# Patient Record
Sex: Male | Born: 1935 | Hispanic: No | Marital: Single | Smoking: Never smoker
Health system: Southern US, Community
[De-identification: ages and names within clinical notes are randomized; demographics above are authoritative.]

## PROBLEM LIST (undated history)

## (undated) DIAGNOSIS — N185 Chronic kidney disease, stage 5: Secondary | ICD-10-CM

## (undated) DIAGNOSIS — E7800 Pure hypercholesterolemia, unspecified: Secondary | ICD-10-CM

## (undated) DIAGNOSIS — I10 Essential (primary) hypertension: Secondary | ICD-10-CM

---

## 2010-02-27 ENCOUNTER — Ambulatory Visit: Admit: 2010-02-27 | Payer: Self-pay | Admitting: Vascular Surgery

## 2010-02-27 ENCOUNTER — Ambulatory Visit
Admission: RE | Admit: 2010-02-27 | Discharge: 2010-02-27 | Payer: Self-pay | Source: Home / Self Care | Attending: Vascular Surgery | Admitting: Vascular Surgery

## 2010-04-02 ENCOUNTER — Ambulatory Visit (HOSPITAL_COMMUNITY)
Admission: RE | Admit: 2010-04-02 | Discharge: 2010-04-02 | Disposition: A | Discharge status: Home / Self Care | Payer: Medicare Other | Source: Ambulatory Visit | Attending: Vascular Surgery | Admitting: Vascular Surgery

## 2010-04-02 ENCOUNTER — Other Ambulatory Visit: Payer: Self-pay | Admitting: Vascular Surgery

## 2010-04-02 ENCOUNTER — Encounter (HOSPITAL_COMMUNITY)
Admission: RE | Admit: 2010-04-02 | Discharge: 2010-04-02 | Disposition: A | Discharge status: Home / Self Care | Payer: Medicare Other | Source: Ambulatory Visit | Attending: Vascular Surgery | Admitting: Vascular Surgery

## 2010-04-02 DIAGNOSIS — Z01818 Encounter for other preprocedural examination: Secondary | ICD-10-CM | POA: Insufficient documentation

## 2010-04-02 DIAGNOSIS — I359 Nonrheumatic aortic valve disorder, unspecified: Secondary | ICD-10-CM | POA: Insufficient documentation

## 2010-04-02 DIAGNOSIS — N186 End stage renal disease: Secondary | ICD-10-CM

## 2010-04-02 DIAGNOSIS — I120 Hypertensive chronic kidney disease with stage 5 chronic kidney disease or end stage renal disease: Secondary | ICD-10-CM | POA: Insufficient documentation

## 2010-04-02 LAB — SURGICAL PCR SCREEN
MRSA, PCR: NEGATIVE
Staphylococcus aureus: NEGATIVE

## 2010-04-02 LAB — CBC
MCH: 32.5 pg (ref 26.0–34.0)
MCHC: 33.4 g/dL (ref 30.0–36.0)
Platelets: 199 10*3/uL (ref 150–400)
RBC: 4.19 MIL/uL — ABNORMAL LOW (ref 4.22–5.81)
RDW: 12.8 % (ref 11.5–15.5)

## 2010-04-02 LAB — BASIC METABOLIC PANEL
Calcium: 9.9 mg/dL (ref 8.4–10.5)
Chloride: 106 mEq/L (ref 96–112)
Creatinine, Ser: 4.64 mg/dL — ABNORMAL HIGH (ref 0.4–1.5)
GFR calc Af Amer: 15 mL/min — ABNORMAL LOW (ref >60)
GFR calc non Af Amer: 12 mL/min — ABNORMAL LOW (ref >60)

## 2010-04-04 ENCOUNTER — Ambulatory Visit (HOSPITAL_COMMUNITY)
Admission: RE | Admit: 2010-04-04 | Discharge: 2010-04-04 | Disposition: A | Discharge status: Home / Self Care | Payer: Medicare Other | Source: Ambulatory Visit | Attending: Vascular Surgery | Admitting: Vascular Surgery

## 2010-04-04 DIAGNOSIS — I120 Hypertensive chronic kidney disease with stage 5 chronic kidney disease or end stage renal disease: Secondary | ICD-10-CM

## 2010-04-04 DIAGNOSIS — N186 End stage renal disease: Secondary | ICD-10-CM | POA: Insufficient documentation

## 2010-04-08 NOTE — Op Note (Signed)
  Robert Weber, Robert Weber                   ACCOUNT NO.:  [id number]  MEDICAL RECORD NO.:  [id number]           PATIENT TYPE:  O  LOCATION:  SDSC                         FACILITY:  [facility]  PHYSICIAN:  Di Kindle. Edilia Bo, M.D.DATE OF BIRTH:  Jun 30, 1931  DATE OF PROCEDURE:  04/04/2010 DATE OF DISCHARGE:  04/04/2010                              OPERATIVE REPORT   PREOPERATIVE DIAGNOSIS:  Chronic kidney disease.  POSTOPERATIVE DIAGNOSIS:  Chronic kidney disease.  PROCEDURE:  Placement of left radiocephalic arteriovenous fistula.  SURGEON:  Di Kindle. Edilia Bo, MD  ASSISTANT:  Nurse.  ANESTHESIA:  General.  TECHNIQUE:  The patient was taken to the operating room, sedated by Anesthesia, and then received a LMA.  The left upper extremity was prepped and draped in usual sterile fashion.  The cephalic vein was fairly far lateral on the wrist, and therefore, I elected to make two separate incisions, one over the vein and one over the radial artery. Longitudinal incision was made over the radial artery.  The radial artery was good size and soft with no significant plaque.  A longitudinal incision was made over the cephalic vein which was dissected free and ligated distally, irrigated nicely with heparinized saline, it was about a 4.5-mm vein.  Several branches were divided between ties.  The vein was then mobilized over for anastomosis to the radial artery.  The patient was heparinized.  The radial artery was clamped proximally and distally, and longitudinal arteriotomy was made. The vein was spatulated and sewn end-to-side to the artery using continuous 6-0 Prolene suture.  At the completion, there was an excellent thrill in the fistula.  Hemostasis was obtained in the wounds, the wounds were closed with deep layer of 3-0 Vicryl.  The skin closed with 4-0 Vicryl.  Sterile dressing was applied.  The patient tolerated the procedure well and was transferred to recovery room in  stable condition.  All needle and sponge counts were correct.     Di Kindle. Edilia Bo, M.D.     CSD/MEDQ  D:  04/04/2010  T:  04/04/2010  Job:  [id number]  Electronically Signed by Waverly Ferrari M.D. on 04/08/2010 07:54:33 AM

## 2010-05-15 ENCOUNTER — Ambulatory Visit (INDEPENDENT_AMBULATORY_CARE_PROVIDER_SITE_OTHER): Payer: Medicare Other | Admitting: Vascular Surgery

## 2010-05-15 DIAGNOSIS — N186 End stage renal disease: Secondary | ICD-10-CM

## 2010-05-16 NOTE — Assessment & Plan Note (Signed)
OFFICE VISIT  Robert Weber, Robert Weber DOB:  1931/09/15                                       05/15/2010 CHART#:[id number]  I saw the patient in the office today for followup after placement of his left radiocephalic fistula on April 04, 2010.  He is not yet on dialysis.  He comes in for a routine check of his fistula.  On examination, blood pressure is 142/70, heart rate is 94, temperature 97.4.  His incision in the left wrist has healed nicely.  The fistula appears to be enlarging nicely and has an excellent thrill.  I think the fistula should provide adequate access if it is needed. However, hopefully, he will not require dialysis.  I will see him back p.r.n.    Di Kindle. Edilia Bo, M.D. Electronically Signed  CSD/MEDQ  D:  05/15/2010  T:  05/16/2010  Job:  [id number]  cc:   Cecille Aver, M.D.

## 2010-07-02 NOTE — Consult Note (Signed)
NEW PATIENT CONSULTATION   Robert Weber, Robert Weber  DOB:  Jul 07, 1931                                       02/27/2010  CHART#:[id number]   I saw the patient in the office today in consultation for hemodialysis  access.  He was referred by Dr. Kathrene Bongo.  This is a pleasant 77-  year-old right-handed gentleman with a history of chronic kidney  disease.  The etiology of this is unclear.  Dr. Kathrene Bongo feels that  he may have some form of a chronic glomerulonephritis.  It is felt that  he will ultimately require dialysis and we were asked to place access.  Of note, he had no recent uremic symptoms.  Specifically, he denies  nausea, vomiting, fatigue, anorexia, palpitations, or shortness of  breath.   PAST MEDICAL HISTORY:  1. Hypertension.  2. Hyperlipidemia.  3. Hypothyroidism.  4. Stage 4 chronic kidney disease.  5. He denies any history of diabetes, history of previous myocardial      infarction, history of congestive heart failure, or history of      COPD.   SOCIAL HISTORY:  He is single.  He does not smoke cigarettes.   FAMILY HISTORY:  There is no history of premature cardiovascular  disease.   REVIEW OF SYSTEMS:  GENERAL:  He has had no weight loss, weight gain, or  problems with his appetite.   CARDIOVASCULAR:  He has had no chest pain, chest pressure, palpitations,  or arrhythmias.  He has had no claudication, rest pain, or nonhealing  ulcers.  He had no history of DVT or phlebitis.   MUSCULOSKELETAL:  He does have arthritis in his hands.   GU:  He had no dysuria or frequency.   HEMATOLOGIC:  He had no bleeding problems or clotting disorders.   GI, NEUROLOGIC, PULMONARY, ENT, PSYCHIATRIC review of systems are  unremarkable.   SKIN:  He has had a previous skin cancer removed from his right forearm.   PHYSICAL EXAMINATION:  This is a pleasant 77 year old gentleman who  appears his stated age.   Blood pressure is 144/73, heart rate is 78,  saturation 99%.   HEENT:  Unremarkable.   Lungs are clear bilaterally to auscultation without rales, rhonchi, or  wheezing.   Cardiovascular exam:  I do not detect any carotid bruits.  He has a  regular rate and rhythm.  He has palpable femoral pulses and warm, well-  perfused feet without ischemic ulcers.  He has no significant lower  extremity swelling.  He has palpable radial pulses bilaterally.   Abdomen:  Soft and nontender with normal pitched bowel sounds.   Musculoskeletal exam:  No major deformities or cyanosis.   Neurologic exam:  No focal weakness or paresthesias.   Skin:  There are no ulcers or rashes.   Extremity exam:  It appears that he has a reasonable forearm cephalic  vein in the left arm.   I have independently interpreted his vein mapping.  It appears that he  has a reasonable basilic vein in both upper arms.  In the left forearm,  the vein is noted to be fairly small and in the upper arm it cannot be  visualized.  I suspect the cephalic vein in the left forearm empties  into the deep system or the basilic vein.  On the right side, the  forearm vein is  also marginal in size as is the upper arm cephalic vein.   I have also reviewed his records from Dr. Jon Gills office.  He did  have labs done on January 17, 2010, which at that time showed a  potassium of 4.3 and his creatinine was 3.43 which remained relatively  stable.   Based on his exam, I think he does have a chance of having a  radiocephalic fistula on the left.  If this vein is not found be  adequate, then he could potentially have a basilic vein transposition on  the left.  If neither are adequate, he can have an AV graft placed.  We  have this indications for surgery and the potential complications  including but not limited to failure of the fistula to mature, graft  thrombosis, graft infection, steal syndrome, bleeding, arm swelling, or  other unpredictable medical problems.  All of his  questions were  answered.  Currently, he does not want to schedule surgery until he has  had a chance to see Dr. Kathrene Bongo one more time.  Once he is  comfortable with scheduling surgery, we will be happy to arrange for  placement of access in his left arm.     Di Kindle. Edilia Bo, M.D.  Electronically Signed   CSD/MEDQ  D:  02/27/2010  T:  02/28/2010  Job:  [id number]   cc:   Cecille Aver, M.D.

## 2010-07-02 NOTE — Procedures (Signed)
CEPHALIC VEIN MAPPING   INDICATION:  Preoperative AVF placement.   HISTORY:  Chronic kidney disease, stage 4, hypertension, hyperlipidemia.   EXAM:  The right cephalic vein is compressible.   Diameter measurements range from 0.19 to 0.27 cm.   The right basilic vein is compressible.   Diameter measurements range from 0.19 to 0.43 cm.   The left cephalic vein is compressible.   Diameter measurements range from 0.09 to 0.20 cm.   The left  basilic vein is compressible.   Diameter measurements range from 0.14 to 0.43.   See attached worksheet for all measurements.    IMPRESSION:  Patent right and left cephalic and basilic veins with  diameter measurements as described above.          ___________________________________________  Di Kindle. Edilia Bo, M.D.   EM/MEDQ  D:  02/27/2010  T:  02/27/2010  Job:  [id number]

## 2013-05-18 ENCOUNTER — Encounter (HOSPITAL_COMMUNITY): Payer: Self-pay | Admitting: Internal Medicine

## 2013-05-18 ENCOUNTER — Inpatient Hospital Stay (HOSPITAL_COMMUNITY)
Admission: AD | Admit: 2013-05-18 | Discharge: 2013-05-26 | DRG: 469 | Disposition: A | Discharge status: Skilled Nursing Facility | Payer: Medicare Other | Source: Other Acute Inpatient Hospital | Attending: Internal Medicine | Admitting: Internal Medicine

## 2013-05-18 DIAGNOSIS — W19XXXA Unspecified fall, initial encounter: Secondary | ICD-10-CM | POA: Diagnosis present

## 2013-05-18 DIAGNOSIS — D62 Acute posthemorrhagic anemia: Secondary | ICD-10-CM

## 2013-05-18 DIAGNOSIS — S72009A Fracture of unspecified part of neck of unspecified femur, initial encounter for closed fracture: Principal | ICD-10-CM

## 2013-05-18 DIAGNOSIS — IMO0002 Reserved for concepts with insufficient information to code with codable children: Secondary | ICD-10-CM

## 2013-05-18 DIAGNOSIS — Y92009 Unspecified place in unspecified non-institutional (private) residence as the place of occurrence of the external cause: Secondary | ICD-10-CM

## 2013-05-18 DIAGNOSIS — E7800 Pure hypercholesterolemia, unspecified: Secondary | ICD-10-CM | POA: Diagnosis present

## 2013-05-18 DIAGNOSIS — E669 Obesity, unspecified: Secondary | ICD-10-CM | POA: Diagnosis present

## 2013-05-18 DIAGNOSIS — D638 Anemia in other chronic diseases classified elsewhere: Secondary | ICD-10-CM | POA: Diagnosis present

## 2013-05-18 DIAGNOSIS — N186 End stage renal disease: Secondary | ICD-10-CM | POA: Diagnosis present

## 2013-05-18 DIAGNOSIS — N185 Chronic kidney disease, stage 5: Secondary | ICD-10-CM

## 2013-05-18 DIAGNOSIS — E872 Acidosis, unspecified: Secondary | ICD-10-CM | POA: Diagnosis present

## 2013-05-18 DIAGNOSIS — E039 Hypothyroidism, unspecified: Secondary | ICD-10-CM | POA: Diagnosis present

## 2013-05-18 DIAGNOSIS — S72002A Fracture of unspecified part of neck of left femur, initial encounter for closed fracture: Secondary | ICD-10-CM

## 2013-05-18 DIAGNOSIS — I120 Hypertensive chronic kidney disease with stage 5 chronic kidney disease or end stage renal disease: Secondary | ICD-10-CM | POA: Diagnosis present

## 2013-05-18 HISTORY — DX: Pure hypercholesterolemia, unspecified: E78.00

## 2013-05-18 HISTORY — DX: Chronic kidney disease, stage 5: N18.5

## 2013-05-18 HISTORY — DX: Essential (primary) hypertension: I10

## 2013-05-18 MED ORDER — SIMVASTATIN 20 MG PO TABS
20.0000 mg | ORAL_TABLET | Freq: Every day | ORAL | Status: DC
  Administered 2013-05-19 – 2013-05-26 (×7): 20 mg via ORAL
  Filled 2013-05-18 (×8): qty 1

## 2013-05-18 MED ORDER — SODIUM CHLORIDE 0.9 % IV SOLN
INTRAVENOUS | Status: DC
  Administered 2013-05-19: 01:00:00 via INTRAVENOUS

## 2013-05-18 MED ORDER — CALCITRIOL 0.25 MCG PO CAPS
0.2500 ug | ORAL_CAPSULE | Freq: Every day | ORAL | Status: DC
  Administered 2013-05-19 – 2013-05-26 (×7): 0.25 ug via ORAL
  Filled 2013-05-18 (×9): qty 1

## 2013-05-18 MED ORDER — LEVOTHYROXINE SODIUM 100 MCG PO TABS
100.0000 ug | ORAL_TABLET | Freq: Every day | ORAL | Status: DC
  Administered 2013-05-19 – 2013-05-26 (×7): 100 ug via ORAL
  Filled 2013-05-18 (×9): qty 1

## 2013-05-18 MED ORDER — HYDROCODONE-ACETAMINOPHEN 5-325 MG PO TABS
1.0000 | ORAL_TABLET | Freq: Four times a day (QID) | ORAL | Status: DC | PRN
  Administered 2013-05-19 – 2013-05-20 (×3): 2 via ORAL
  Filled 2013-05-18 (×3): qty 2
  Filled 2013-05-18: qty 1
  Filled 2013-05-18 (×2): qty 2

## 2013-05-18 MED ORDER — MORPHINE SULFATE 2 MG/ML IJ SOLN
0.5000 mg | INTRAMUSCULAR | Status: DC | PRN
  Filled 2013-05-18: qty 1

## 2013-05-18 MED ORDER — AMLODIPINE BESYLATE 10 MG PO TABS
10.0000 mg | ORAL_TABLET | Freq: Every day | ORAL | Status: DC
  Administered 2013-05-19 – 2013-05-20 (×2): 10 mg via ORAL
  Filled 2013-05-18 (×4): qty 1

## 2013-05-18 MED ORDER — ASPIRIN EC 325 MG PO TBEC
325.0000 mg | DELAYED_RELEASE_TABLET | Freq: Every day | ORAL | Status: DC
  Administered 2013-05-19 – 2013-05-20 (×2): 325 mg via ORAL
  Filled 2013-05-18 (×3): qty 1

## 2013-05-18 NOTE — Progress Notes (Signed)
PENDING ACCEPTANCE TRANFER NOTE:  [facility]  REASON FOR REQUESTING TRANSFER:    Left hip fracture and renal failure  HPI:   80 year old male with past medical history of CKD stage IV, or it has calluses access in place since 2012. [facility] with [facility] and left hip fracture, also was found to have creatinine of 7.0. Baseline creatinine about 4 to 4.6. Dr. Eulah PontMurphy of orthopedics oxygen the patient, anticipated that he might need dialysis with this high creatinine patient [facility].  Last set of vitals respirations 18, blood pressure 115/76, pulse is 83 and saturation is 96% on room air.   PLAN:  According to telephone report, this [facility]   [facility]  [id number],  I have requested an order be written to call Flow Manager at [phone] upon patient arrival to the floor for final physician assignment who will do the admission and give admitting orders.  SIGNED: Clint LippsELMAHI,Karisa Nesser A, MD [facility]  05/18/2013, 5:44 PM

## 2013-05-18 NOTE — H&P (Signed)
[facility] History and Physical  Traveon Louro RUE:[id number] DOB: 10-20-1931 DOA: 05/18/2013  Referring physician: EDP PCP: No primary provider on file.   Chief Complaint: Fall, hip pain   HPI: Capri Raben is a 80 y.o. male who lost his ballance and fell at home.  He landed on his left hip.  Had severe left hip pain, worse with movement, better at rest.  As a result he presents to the ED at [facility].  In the ED patient found to have a left hip fracture, also has a large hematoma of his left shin.  His creatinine was noted to be 7.0, late last year it was last checked and found to be at his baseline of 4.6.  His baseline has been 4.6 since 2012 when he had a dialysis fistula put in place.  He has never had dialysis and reports that he doubts the fistula is functional.  Review of Systems: Systems reviewed.  As above, otherwise negative  Past Medical History  Diagnosis Date  . CKD (chronic kidney disease) stage 5, GFR less than 15 ml/min   . HTN (hypertension)   . High cholesterol    No past surgical history on file. Social History:  reports that he has never smoked. He does not have any smokeless tobacco history on file. He reports that he does not drink alcohol or use illicit drugs.  No Known Allergies  No family history on file.   Prior to Admission medications   Medication Sig Start Date End Date Taking? Authorizing Provider  amLODipine (NORVASC) 10 MG tablet Take 10 mg by mouth daily. 04/29/13  Yes Historical Provider, MD  aspirin (ASPIRIN EC) 81 MG EC tablet Take 81 mg by mouth daily. Swallow whole.   Yes Historical Provider, MD  calcitRIOL (ROCALTROL) 0.25 MCG capsule Take 0.25 mcg by mouth daily. 04/18/13  Yes Historical Provider, MD  furosemide (LASIX) 40 MG tablet Take 40 mg by mouth daily. 04/29/13  Yes Historical Provider, MD  HYDROcodone-acetaminophen (NORCO/VICODIN) 5-325 MG per tablet Take 1 tablet by mouth every 6 (six) hours as needed. PAIN 05/05/13  Yes  Historical Provider, MD  levothyroxine (SYNTHROID, LEVOTHROID) 100 MCG tablet Take 100 mcg by mouth daily. 05/03/13  Yes Historical Provider, MD  pravastatin (PRAVACHOL) 40 MG tablet Take 40 mg by mouth daily. 04/29/13  Yes Historical Provider, MD   Physical Exam: Filed Vitals:   05/18/13 2138  BP: 152/74  Pulse: 109  Temp: 98.7 F (37.1 C)  Resp: 20    BP 152/74  Pulse 109  Temp(Src) 98.7 F (37.1 C) (Oral)  Resp 20  Ht 6' (1.829 m)  Wt 80.015 kg (176 lb 6.4 oz)  BMI 23.92 kg/m2  SpO2 94%  General Appearance:    Alert, oriented, no distress, appears stated age  Head:    Normocephalic, atraumatic  Eyes:    PERRL, EOMI, sclera non-icteric        Nose:   Nares without drainage or epistaxis. Mucosa, turbinates normal  Throat:   Moist mucous membranes. Oropharynx without erythema or exudate.  Neck:   Supple. No carotid bruits.  No thyromegaly.  No lymphadenopathy.   Back:     No CVA tenderness, no spinal tenderness  Lungs:     Clear to auscultation bilaterally, without wheezes, rhonchi or rales  Chest wall:    No tenderness to palpitation  Heart:    Regular rate and rhythm without murmurs, gallops, rubs  Abdomen:     Soft, non-tender, nondistended, normal  bowel sounds, no organomegaly  Genitalia:    deferred  Rectal:    deferred  Extremities:   L hip tenderness, his DP pulse in his L foot is strong and palpable with surprising ease.  The hematoma is on the anterior lateral aspect of his shin, there is some tension, but no ischemic skin changes, no pain out of proportion to the size of the hematoma, no evidence at this point of vascular compromise as would be seen in compartment syndrome.  Regarding his dialysis access, I am unable to palpate a thrill or pulse in the fistula.  Pulses:   2+ and symmetric all extremities  Skin:   Skin color, texture, turgor normal, no rashes or lesions  Lymph nodes:   Cervical, supraclavicular, and axillary nodes normal  Neurologic:   CNII-XII  intact. Normal strength, sensation and reflexes      throughout    Labs on Admission:  Basic Metabolic Panel: No results found for this basename: NA, K, CL, CO2, GLUCOSE, BUN, CREATININE, CALCIUM, MG, PHOS,  in the last 168 hours Liver Function Tests: No results found for this basename: AST, ALT, ALKPHOS, BILITOT, PROT, ALBUMIN,  in the last 168 hours No results found for this basename: LIPASE, AMYLASE,  in the last 168 hours No results found for this basename: AMMONIA,  in the last 168 hours CBC: No results found for this basename: WBC, NEUTROABS, HGB, HCT, MCV, PLT,  in the last 168 hours Cardiac Enzymes: No results found for this basename: CKTOTAL, CKMB, CKMBINDEX, TROPONINI,  in the last 168 hours  BNP (last 3 results) No results found for this basename: PROBNP,  in the last 8760 hours CBG: No results found for this basename: GLUCAP,  in the last 168 hours  Radiological Exams on Admission: No results found.  EKG: Independently reviewed.  Assessment/Plan Principal Problem:   Fracture of femoral neck, left Active Problems:   CKD (chronic kidney disease) stage 5, GFR less than 15 ml/min   1. Left femoral neck fracture - patient on hip fracture protocol, NPO, Dr. Eulah PontMurphy was consulted earlier this afternoon, likely to evaluate for OR tomorrow. 2. CKD stage 5 - creatinine 7.0 with baseline 4.6.  Most likely the 7.0 is his new baseline and represents chronic deterioration of his CKD as opposed to new AKI.  Prior to the fall this afternoon patient had actually felt quite well and had no complaints.  No recent illness to explain the deterioration.  Will gently hydrate by holding patients lasix and giving very small amount of IVF, repeat labs in AM.  At this point there are no urgent indications for dialysis tonight.  Additionally he needs access if dialysis were required (fistula no longer has palpable pulse or thrill, not at all surprising given its age).  Spoke with Dr. Hyman HopesWebb who will  consult in AM, doesn't think that there is a reason to delay surgery from a renal standpoint.  Code Status: Full Code  Family Communication: Family at bedside Disposition Plan: Admit to inpatient   Time spent: 70 min  GARDNER, JARED M. [facility] Pager [phone]  If 7AM-7PM, please contact the day team taking care of the patient [email or web address] Password [email or web address] 05/18/2013, 10:32 PM

## 2013-05-19 ENCOUNTER — Inpatient Hospital Stay (HOSPITAL_COMMUNITY): Payer: Medicare Other

## 2013-05-19 ENCOUNTER — Encounter (HOSPITAL_COMMUNITY): Payer: Self-pay | Admitting: General Practice

## 2013-05-19 ENCOUNTER — Other Ambulatory Visit (HOSPITAL_COMMUNITY): Payer: Self-pay | Admitting: Orthopedic Surgery

## 2013-05-19 DIAGNOSIS — T82898A Other specified complication of vascular prosthetic devices, implants and grafts, initial encounter: Secondary | ICD-10-CM

## 2013-05-19 DIAGNOSIS — N186 End stage renal disease: Secondary | ICD-10-CM

## 2013-05-19 DIAGNOSIS — S72009A Fracture of unspecified part of neck of unspecified femur, initial encounter for closed fracture: Secondary | ICD-10-CM | POA: Diagnosis present

## 2013-05-19 LAB — CBC
HEMATOCRIT: 30.7 % — AB (ref 39.0–52.0)
HEMOGLOBIN: 10.4 g/dL — AB (ref 13.0–17.0)
MCH: 32.9 pg (ref 26.0–34.0)
MCHC: 33.9 g/dL (ref 30.0–36.0)
MCV: 97.2 fL (ref 78.0–100.0)
Platelets: 197 10*3/uL (ref 150–400)
RBC: 3.16 MIL/uL — AB (ref 4.22–5.81)
RDW: 13.4 % (ref 11.5–15.5)
WBC: 7.2 10*3/uL (ref 4.0–10.5)

## 2013-05-19 LAB — BASIC METABOLIC PANEL
BUN: 71 mg/dL — AB (ref 6–23)
CO2: 16 mEq/L — ABNORMAL LOW (ref 19–32)
Calcium: 9.3 mg/dL (ref 8.4–10.5)
Chloride: 108 mEq/L (ref 96–112)
Creatinine, Ser: 6.1 mg/dL — ABNORMAL HIGH (ref 0.50–1.35)
GFR calc Af Amer: 9 mL/min — ABNORMAL LOW (ref >90)
GFR calc non Af Amer: 8 mL/min — ABNORMAL LOW (ref >90)
GLUCOSE: 110 mg/dL — AB (ref 70–99)
Potassium: 4.7 mEq/L (ref 3.7–5.3)
SODIUM: 146 meq/L (ref 137–147)

## 2013-05-19 MED ORDER — SODIUM BICARBONATE 8.4 % IV SOLN
INTRAVENOUS | Status: DC
  Administered 2013-05-19 – 2013-05-21 (×3): via INTRAVENOUS
  Filled 2013-05-19 (×8): qty 850

## 2013-05-19 MED ORDER — STERILE WATER FOR INJECTION IV SOLN: 3.0000 | INTRAVENOUS | Status: DC

## 2013-05-19 NOTE — Consult Note (Signed)
[facility] [name] CONSULT [place]   MRN : [id number]  Reason for Consult: AKI on CKD Evaluate for new AV access. Referring Physician: COLADONATO,JOSEPH A   History of Present Illness: 80 y/o with CKD is admitted secondary to fall and required left hip repair s/p fracture.  PMH sig for HTN, hypothyroidism, and CKD stage 4 secondary to hypertension [Scr running 5.7 (4/14) to 5.9 in (10/14)] He had a failed left AV fistula and currently not requiring dialysis.  We are asked to gain access for dialysis.  The patient is medically managed for hypercholesterolemia with a statin.      Current Facility-Administered Medications  Medication Dose Route Frequency Provider Last Rate Last Dose  . amLODipine (NORVASC) tablet 10 mg  10 mg Oral Daily Hillary BowJared M Gardner, DO   10 mg at 05/19/13 0956  . aspirin EC tablet 325 mg  325 mg Oral Daily Hillary BowJared M Gardner, DO   325 mg at 05/19/13 [id number]  . calcitRIOL (ROCALTROL) capsule 0.25 mcg  0.25 mcg Oral Daily Hillary BowJared M Gardner, DO   0.25 mcg at 05/19/13 0956  . HYDROcodone-acetaminophen (NORCO/VICODIN) 5-325 MG per tablet 1-2 tablet  1-2 tablet Oral Q6H PRN Hillary BowJared M Gardner, DO   2 tablet at 05/19/13 0106  . levothyroxine (SYNTHROID, LEVOTHROID) tablet 100 mcg  100 mcg Oral QAC breakfast Hillary BowJared M Gardner, DO   100 mcg at 05/19/13 [id number]  . morphine 2 MG/ML injection 0.5 mg  0.5 mg Intravenous Q2H PRN Hillary BowJared M Gardner, DO      . simvastatin (ZOCOR) tablet 20 mg  20 mg Oral q1800 Hillary BowJared M Gardner, DO      . sodium bicarbonate 150 mEq in sterile water 1,000 mL infusion   Intravenous Continuous Clydia LlanoMutaz Elmahi, MD 50 mL/hr at 05/19/13 1556      Pt meds include: Statin :Yes Betablocker: No ASA: Yes Other anticoagulants/antiplatelets:   Past Medical History  Diagnosis Date  . CKD (chronic kidney disease) stage 5, GFR less than 15 ml/min   . HTN (hypertension)   . High cholesterol     History reviewed. No pertinent past surgical history.  Social  History History  Substance Use Topics  . Smoking status: Never Smoker   . Smokeless tobacco: Not on file  . Alcohol Use: No    Family History   No Known Allergies   REVIEW OF SYSTEMS  General: [ ]  Weight loss, [ ]  Fever, [ ]  chills Neurologic: [ ]  Dizziness, [ ]  Blackouts, [ ]  Seizure [ ]  Stroke, [ ]  "Mini stroke", [ ]  Slurred speech, [ ]  Temporary blindness; [ ]  weakness in arms or legs, [ ]  Hoarseness [ ]  Dysphagia Cardiac: [ ]  Chest pain/pressure, [ ]  Shortness of breath at rest [ ]  Shortness of breath with exertion, [ ]  Atrial fibrillation or irregular heartbeat  Vascular: [ ]  Pain in legs with walking, [ ]  Pain in legs at rest, [ ]  Pain in legs at night,  [ ]  Non-healing ulcer, [ ]  Blood clot in vein/DVT,   Pulmonary: [ ]  Home oxygen, [ ]  Productive cough, [ ]  Coughing up blood, [ ]  Asthma,  [ ]  Wheezing [ ]  COPD Musculoskeletal:  [ ]  Arthritis, [ ]  Low back pain, [x ] Joint pain Left hip Hematologic: [ ]  Easy Bruising, [ ]  Anemia; [ ]  Hepatitis Gastrointestinal: [ ]  Blood in stool, [ ]  Gastroesophageal Reflux/heartburn, Urinary: [x ] chronic Kidney disease, [ ]  on HD - [ ]  MWF or [ ]   TTHS, [ ]  Burning with urination, [ ]  Difficulty urinating Skin: [ ]  Rashes, [ ]  Wounds Psychological: [ ]  Anxiety, [ ]  Depression  Physical Examination Filed Vitals:   05/19/13 0958 05/19/13 1137 05/19/13 1433 05/19/13 1544  BP: 135/78  140/74   Pulse: 99  95   Temp: 98.4 F (36.9 C)  97.4 F (36.3 C)   TempSrc: Oral  Oral   Resp: 18 16 20 16   Height:      Weight:      SpO2: 92% 93% 94% 93%   Body mass index is 23.92 kg/(m^2).  General:  WDWN in NAD Gait: Normal HENT: WNL Eyes: Pupils equal Pulmonary: normal non-labored breathing , without Rales, rhonchi,  wheezing Cardiac: RRR, without  Murmurs, rubs or gallops; No carotid bruits Abdomen: soft, NT, no masses Skin: no rashes, ulcers noted;  no Gangrene , no cellulitis; no open wounds;   Vascular Exam/Pulses:Palpable radial  and left brachial pulses.  No palpable thrill in the left forearm.   Musculoskeletal: no muscle wasting or atrophy; no edema On the right.  Painful motion on the left LE s/p fractures hip Neurologic: A&O X 3; Appropriate Affect ;  SENSATION: normal; MOTOR FUNCTION: 5/5 Symmetric Speech is fluent/normal   Significant Diagnostic Studies: CBC Lab Results  Component Value Date   WBC 7.2 05/19/2013   HGB 10.4* 05/19/2013   HCT 30.7* 05/19/2013   MCV 97.2 05/19/2013   PLT 197 05/19/2013    BMET    Component Value Date/Time   NA 146 05/19/2013 0716   K 4.7 05/19/2013 0716   CL 108 05/19/2013 0716   CO2 16* 05/19/2013 0716   GLUCOSE 110* 05/19/2013 0716   BUN 71* 05/19/2013 0716   CREATININE 6.10* 05/19/2013 0716   CALCIUM 9.3 05/19/2013 0716   GFRNONAA 8* 05/19/2013 0716   GFRAA 9* 05/19/2013 0716   Estimated Creatinine Clearance: 10.8 ml/min (by C-G formula based on Cr of 6.1).  COAG No results found for this basename: INR, PROTIME     Non-Invasive Vascular Imaging: pending vein mapping  ASSESSMENT/PLAN:  AKI/CKD Plan dialysis acces left upper extremity verse right.  He is right hand dominant. Previous left fore arm AV fistula He is not currently on dialysis.   Robert Weber [facility] 05/19/2013 4:07 PM  I agree with the above.  The patient has a failed left radiocephalic fistula.  He is right-handed.  The most logical next operation would be a left brachiocephalic fistula.  I have ordered vein mapping to ensure the patency of his left cephalic vein.  He can be placed on the operating room schedule after his hip surgery, which is scheduled for Friday.  Alternatively, he could have this done as an outpatient after discharge.  Dr. early will be covering this weekend.  These contact him if this needs to be done prior to his discharge and we can arrange for this to be done early next week.

## 2013-05-19 NOTE — Progress Notes (Signed)
.   INITIAL NUTRITION ASSESSMENT  DOCUMENTATION CODES Per approved criteria  -Not Applicable   INTERVENTION: Encourage adequate PO intake Provide Ensure Complete BID if meal completion remains poor  NUTRITION DIAGNOSIS: Predicted sub optimal energy intake related to scheduled surgery as evidenced by pt's chart.   Goal: Pt to meet >/= 90% of their estimated nutrition needs   Monitor:  PO intake post surgery, weight trend, labs  Reason for Assessment: Consult (Hip fracture protocol)  80 y.o. male  Admitting Dx: Fracture of femoral neck, left  ASSESSMENT: 80 y.o. male who lost his ballance and fell at home. He landed on his left hip. Had severe left hip pain, worse with movement, better at rest. As a result he presents to the ED at [facility]. Pt has history of HTN and CKD.   Pt denies any recent weight loss stating he usually weighs 176 lbs. He reports having a good appetite and eating well PTA. Pt states that he did not receive breakfast today but, his appetite is good and he ate well at lunch. Lunch tray at bedside- pt ate about 50%. Pt states he doesn't like [place]. Encouraged pt to eat well, especially after surgery. Pt agreeable to trying nutritional supplements if PO intake is poor.  Labs: Low hemoglobin, high BUN, low GFR, high creatinine  Height: Ht Readings from Last 1 Encounters:  05/18/13 6' (1.829 m)    Weight: Wt Readings from Last 1 Encounters:  05/18/13 176 lb 6.4 oz (80.015 kg)    Ideal Body Weight: 178 lbs  % Ideal Body Weight: 99%  Wt Readings from Last 10 Encounters:  05/18/13 176 lb 6.4 oz (80.015 kg)    Usual Body Weight: 176 lbs  % Usual Body Weight: 100%  BMI:  Body mass index is 23.92 kg/(m^2).  Estimated Nutritional Needs: Kcal: 1900-2100 Protein: 75-85 grams Fluid: 1.9-2.1 L/day  Skin: +2 LLE edema  Diet Order: Cardiac  EDUCATION NEEDS: -No education needs identified at this time   Intake/Output Summary (Last 24 hours)  at 05/19/13 1554 Last data filed at 05/19/13 1433  Gross per 24 hour  Intake    340 ml  Output   1075 ml  Net   -735 ml    Last BM: 4/1   Labs:   Recent Labs Lab 05/19/13 0716  NA 146  K 4.7  CL 108  CO2 16*  BUN 71*  CREATININE 6.10*  CALCIUM 9.3  GLUCOSE 110*    CBG (last 3)  No results found for this basename: GLUCAP,  in the last 72 hours  Scheduled Meds: . amLODipine  10 mg Oral Daily  . aspirin EC  325 mg Oral Daily  . calcitRIOL  0.25 mcg Oral Daily  . levothyroxine  100 mcg Oral QAC breakfast  . simvastatin  20 mg Oral q1800    Continuous Infusions: .  sodium bicarbonate 150 mEq in sterile water 1000 mL infusion      Past Medical History  Diagnosis Date  . CKD (chronic kidney disease) stage 5, GFR less than 15 ml/min   . HTN (hypertension)   . High cholesterol     History reviewed. No pertinent past surgical history.  Ian Malkineanne Barnett RD, LDN Inpatient Clinical Dietitian Pager: [phone] After Hours Pager: [phone]

## 2013-05-19 NOTE — Progress Notes (Signed)
Utilization review completed. Cincere Deprey, RN, BSN. 

## 2013-05-19 NOTE — Consult Note (Signed)
Reason for Consult:AKI/CKD Referring Physician: Arthor Captain, MD  Robert Weber is an 80 y.o. male.  HPI: Pt is a 81yo WM with PMH sig for HTN, hypothyroidism, and CKD stage 4 [Scr running 5.7 (4/14) to 5.9 in (10/14)] who was admitted after a fall at home complicated by L hip fracture.  He was admitted for orthopedic surgical evaluation with plans for repair in the next 24 hours.  His Scr is 6.1 and we were asked to evaluate pt and asked to help manage his advanced CKD in the peri- and post-operative hospitalization.  Overall he feels well and has not been having any signs or symptoms of uremia since his last visit in October.  He denies any NSAIDs/COX-II I's.  Trend in Creatinine: Creatinine, Ser  Date/Time Value Ref Range Status  05/19/2013  7:16 AM 6.10* 0.50 - 1.35 mg/dL Final  1/61/0960 45:40 AM 4.64* 0.4 - 1.5 mg/dL Final    PMH:   Past Medical History  Diagnosis Date  . CKD (chronic kidney disease) stage 5, GFR less than 15 ml/min   . HTN (hypertension)   . High cholesterol     PSH:  History reviewed. No pertinent past surgical history.  Allergies: No Known Allergies  Medications:   Prior to Admission medications   Medication Sig Start Date End Date Taking? Authorizing Provider  amLODipine (NORVASC) 10 MG tablet Take 10 mg by mouth daily. 04/29/13  Yes Historical Provider, MD  aspirin (ASPIRIN EC) 81 MG EC tablet Take 81 mg by mouth daily. Swallow whole.   Yes Historical Provider, MD  calcitRIOL (ROCALTROL) 0.25 MCG capsule Take 0.25 mcg by mouth daily. 04/18/13  Yes Historical Provider, MD  furosemide (LASIX) 40 MG tablet Take 40 mg by mouth daily. 04/29/13  Yes Historical Provider, MD  HYDROcodone-acetaminophen (NORCO/VICODIN) 5-325 MG per tablet Take 1 tablet by mouth every 6 (six) hours as needed. PAIN 05/05/13  Yes Historical Provider, MD  levothyroxine (SYNTHROID, LEVOTHROID) 100 MCG tablet Take 100 mcg by mouth daily. 05/03/13  Yes Historical Provider, MD  pravastatin (PRAVACHOL) 40 MG  tablet Take 40 mg by mouth daily. 04/29/13  Yes Historical Provider, MD    Inpatient medications: . amLODipine  10 mg Oral Daily  . aspirin EC  325 mg Oral Daily  . calcitRIOL  0.25 mcg Oral Daily  . levothyroxine  100 mcg Oral QAC breakfast  . simvastatin  20 mg Oral q1800    Discontinued Meds:  There are no discontinued medications.  Social History:  reports that he has never smoked. He does not have any smokeless tobacco history on file. He reports that he does not drink alcohol or use illicit drugs.  Family History:  History reviewed. No pertinent family history.  A comprehensive review of systems was negative except for: Musculoskeletal: positive for left hip pain and edema of left leg Weight change:   Intake/Output Summary (Last 24 hours) at 05/19/13 1355 Last data filed at 05/19/13 0959  Gross per 24 hour  Intake    100 ml  Output    625 ml  Net   -525 ml   BP 135/78  Pulse 99  Temp(Src) 98.4 F (36.9 C) (Oral)  Resp 16  Ht 6' (1.829 m)  Wt 80.015 kg (176 lb 6.4 oz)  BMI 23.92 kg/m2  SpO2 93% Filed Vitals:   05/19/13 0500 05/19/13 0752 05/19/13 0958 05/19/13 1137  BP: 150/82  135/78   Pulse: 94  99   Temp: 99 F (37.2 C)  98.4  F (36.9 C)   TempSrc: Oral  Oral   Resp: 18 16 18 16   Height:      Weight:      SpO2: 93% 94% 92% 93%     General appearance: alert, cooperative and no distress Head: Normocephalic, without obvious abnormality, atraumatic Eyes: negative findings: lids and lashes normal, conjunctivae and sclerae normal and corneas clear Neck: no adenopathy, no carotid bruit, no JVD, supple, symmetrical, trachea midline and thyroid not enlarged, symmetric, no tenderness/mass/nodules Resp: clear to auscultation bilaterally Cardio: regular rate and rhythm, S1, S2 normal, no murmur, click, rub or gallop GI: soft, non-tender; bowel sounds normal; no masses,  no organomegaly Extremities: edema left trace-1+ pedal edema  Labs: Basic Metabolic  Panel:  Recent Labs Lab 05/19/13 0716  NA 146  K 4.7  CL 108  CO2 16*  GLUCOSE 110*  BUN 71*  CREATININE 6.10*  CALCIUM 9.3   Liver Function Tests: No results found for this basename: AST, ALT, ALKPHOS, BILITOT, PROT, ALBUMIN,  in the last 168 hours No results found for this basename: LIPASE, AMYLASE,  in the last 168 hours No results found for this basename: AMMONIA,  in the last 168 hours CBC:  Recent Labs Lab 05/19/13 0716  WBC 7.2  HGB 10.4*  HCT 30.7*  MCV 97.2  PLT 197   PT/INR: @LABRCNTIP (inr:5) Cardiac Enzymes: )No results found for this basename: CKTOTAL, CKMB, CKMBINDEX, TROPONINI,  in the last 168 hours CBG: No results found for this basename: GLUCAP,  in the last 168 hours  Iron Studies: No results found for this basename: IRON, TIBC, TRANSFERRIN, FERRITIN,  in the last 168 hours  Xrays/Other Studies: No results found.   Assessment/Plan: 1.  CKD stage 4/5- no significant change in overall renal function and no signs/symptoms of uremia.  Will follow closely in the peri/post-operative period.  Avoid NSAIDs/COX-II I's, and IV Contrast. 2. Left hip fracture- for ORIF per Ortho tomorrow. 3. Vascular access- his L AVF has clotted in October.  Will ask [facility] to evaluate for new access.  Hopefully he will not require the initiation of dialysis during this hospitalization, however his CKD has been progressive over the last year. 4. Anemia of chronic disease as well as ABLA from trauma.  Follow H/H.  Will check iron stores and may need transfusion and/or initiation of EPO.Hgb was 11.7 on 04/29/13 5. Metabolic Acidosis- start IV bicarb and follow. 6. HTN- cont with meds.   Makennah Omura A 05/19/2013, 1:55 PM

## 2013-05-19 NOTE — Progress Notes (Signed)
Orthopedic Tech Progress Note Patient Details:  Robert Weber 05/10/1931 [id number]  Ortho Devices Ortho Device/Splint Location: trapeze bar patient helper Ortho Device/Splint Interventions: Application   Nikki Domrawford, Casen Pryor 05/19/2013, 2:26 PM

## 2013-05-19 NOTE — Clinical Social Work Psychosocial (Signed)
Clinical Social Work Department BRIEF PSYCHOSOCIAL ASSESSMENT 05/19/2013  Patient:  Robert Weber,Robert Weber     Account Number:  [id number]     Admit date:  05/18/2013  Clinical Social Worker:  Delmer IslamRAWFORD,VANESSA, LCSW  Date/Time:  05/19/2013 04:36 PM  Referred by:  Physician  Date Referred:  05/19/2013 Referred for  SNF Placement   Other Referral:   Interview type:  Patient Other interview type:   Patient had family present in room.    PSYCHOSOCIAL DATA Living Status:  ALONE Admitted from facility:   Level of care:   Primary support name:   Primary support relationship to patient:   Degree of support available:    CURRENT CONCERNS Current Concerns  Post-Acute Placement   Other Concerns:    SOCIAL WORK ASSESSMENT / PLAN CSW intern introduce self to patient and family. Patient was alert and open to talk with CSW intern. Patient advised CSW intern that he was suppose to have hip replacement and surgery has been pushed back to 05/20/13. Patient and CSW intern both agreed on discussing discharge plans after surgery. Patient advised CSW intern that he would like [facility] either facility or [facility]. CSW will follow up after surgery to give list of SNF's, contact information and discuss D/C plans.   Assessment/plan status:  Psychosocial Support/Ongoing Assessment of Needs Other assessment/ plan:   Information/referral to community resources:   SNF placement.    PATIENT'S/FAMILY'S RESPONSE TO PLAN OF CARE: Patient was pleasant and seem content with CSW intern. CSW will follow up after surgery for D/C plans.       Deniece ReeBrianna Marcelles Clinard, CSW Intern.

## 2013-05-19 NOTE — Care Management Note (Addendum)
   CARE MANAGEMENT NOTE 05/19/2013  Patient:  Robert Weber,Robert Weber   Account Number:  [id number]  Date Initiated:  05/19/2013  Documentation initiated by:  Johny ShockOYAL,Marquetta Weiskopf  Subjective/Objective Assessment:   05[facility]19/13 [facility]     Action/Plan:   4/2 Pt adm with hip fx, pending OR , , will need PT/OT eval post procedure to determine d/c needs. CM will follow for progression and d/c planning.   Anticipated DC Date:  05/26/2013   Anticipated DC Plan:           Choice offered to / List presented to:             Status of service:  In process, will continue to follow Medicare Important Message given?   (If response is "NO", the following Medicare IM given date fields will be blank) Date Medicare IM given:   Date Additional Medicare IM given:    Discharge Disposition:    Per UR Regulation:    If discussed at Long Length of Stay Meetings, dates discussed:    Comments:

## 2013-05-19 NOTE — Progress Notes (Signed)
[facility] PROGRESS NOTE  Army Fossara Kozel ZOX:[id number] DOB: 07/13/1931 DOA: 05/18/2013 PCP: No primary provider on file.  Assessment/Plan: Principal Problem:  Fracture of femoral neck, left  -Reconsulted ortho today- Dr Lajoyce Cornersuda to see and plans surgery in am -appreciate Dr Audrie Liauda's Benny Lennertassisatance -continue pain mangement Active Problems:  CKD (chronic kidney disease) stage 5, GFR less than 15 ml/min -renal consulted and appreciate Dr Grayland Jackolodonato's assistance>> they will follow for peri- op mangement  CXR is neg for infilltrates, he is CP free and and has no h/o cardiac disease.Pt is medically cleared for surgery and per Renal there is a reason to delay surgery from a renal standpoint.     Code Status: full Family Communication: none at bedside  Disposition Plan: likely will need SNF   Consultants:  ORTHO- Dr Lajoyce Cornersuda  renal  Procedures:  none  Antibiotics:  none   HPI/Subjective: Denies CP, sob, and no N/V  Objective: Filed Vitals:   05/19/13 0958  BP: 135/78  Pulse: 99  Temp: 98.4 F (36.9 C)  Resp: 18    Intake/Output Summary (Last 24 hours) at 05/19/13 1023 Last data filed at 05/19/13 0959  Gross per 24 hour  Intake    100 ml  Output    625 ml  Net   -525 ml   Filed Weights   05/18/13 2138  Weight: 80.015 kg (176 lb 6.4 oz)    Exam:  General: alert & oriented x 3 In NAD Cardiovascular: RRR, nl S1 s2 Respiratory: CTAB Abdomen: soft +BS NT/ND, no masses palpable Extremities: No cyanosis and no edema     Data Reviewed: Basic Metabolic Panel:  Recent Labs Lab 05/19/13 0716  NA 146  K 4.7  CL 108  CO2 16*  GLUCOSE 110*  BUN 71*  CREATININE 6.10*  CALCIUM 9.3   Liver Function Tests: No results found for this basename: AST, ALT, ALKPHOS, BILITOT, PROT, ALBUMIN,  in the last 168 hours No results found for this basename: LIPASE, AMYLASE,  in the last 168 hours No results found for this basename: AMMONIA,  in the last 168 hours CBC:  Recent  Labs Lab 05/19/13 0716  WBC 7.2  HGB 10.4*  HCT 30.7*  MCV 97.2  PLT 197   Cardiac Enzymes: No results found for this basename: CKTOTAL, CKMB, CKMBINDEX, TROPONINI,  in the last 168 hours BNP (last 3 results) No results found for this basename: PROBNP,  in the last 8760 hours CBG: No results found for this basename: GLUCAP,  in the last 168 hours  No results found for this or any previous visit (from the past 240 hour(s)).   Studies: No results found.  Scheduled Meds: . amLODipine  10 mg Oral Daily  . aspirin EC  325 mg Oral Daily  . calcitRIOL  0.25 mcg Oral Daily  . levothyroxine  100 mcg Oral QAC breakfast  . simvastatin  20 mg Oral q1800   Continuous Infusions: . sodium chloride 50 mL/hr at 05/19/13 0107    Principal Problem:   Fracture of femoral neck, left Active Problems:   CKD (chronic kidney disease) stage 5, GFR less than 15 ml/min    Time spent: 35    Renn Dirocco C  [facility] Pager [phone]. If 7PM-7AM, please contact night-coverage at [email or web address], password [email or web address] 05/19/2013, 10:23 AM  LOS: 1 day

## 2013-05-19 NOTE — Consult Note (Signed)
Reason for Consult: Left femoral neck fracture. Referring Physician: Elmahi  Robert Weber is an 80 y.o. male.  HPI: Patient is a 80-year-old gentleman with end-stage renal disease hypertension who fell on his left hip sustaining a left femoral neck fracture. Patient was transferred here from [place] [facility] for evaluation and treatment.  Past Medical History  Diagnosis Date  . CKD (chronic kidney disease) stage 5, GFR less than 15 ml/min   . HTN (hypertension)   . High cholesterol     History reviewed. No pertinent past surgical history.  History reviewed. No pertinent family history.  Social History:  reports that he has never smoked. He does not have any smokeless tobacco history on file. He reports that he does not drink alcohol or use illicit drugs.  Allergies: No Known Allergies  Medications: I have reviewed the patient's current medications.  Results for orders placed during the hospital encounter of 05/18/13 (from the past 48 hour(s))  CBC     Status: Abnormal   Collection Time    05/19/13  7:16 AM      Result Value Ref Range   WBC 7.2  4.0 - 10.5 K/uL   RBC 3.16 (*) 4.22 - 5.81 MIL/uL   Hemoglobin 10.4 (*) 13.0 - 17.0 g/dL   HCT 30.7 (*) 39.0 - 52.0 %   MCV 97.2  78.0 - 100.0 fL   MCH 32.9  26.0 - 34.0 pg   MCHC 33.9  30.0 - 36.0 g/dL   RDW 13.4  11.5 - 15.5 %   Platelets 197  150 - 400 K/uL  BASIC METABOLIC PANEL     Status: Abnormal   Collection Time    05/19/13  7:16 AM      Result Value Ref Range   Sodium 146  137 - 147 mEq/L   Potassium 4.7  3.7 - 5.3 mEq/L   Chloride 108  96 - 112 mEq/L   CO2 16 (*) 19 - 32 mEq/L   Glucose, Bld 110 (*) 70 - 99 mg/dL   BUN 71 (*) 6 - 23 mg/dL   Creatinine, Ser 6.10 (*) 0.50 - 1.35 mg/dL   Calcium 9.3  8.4 - 10.5 mg/dL   GFR calc non Af Amer 8 (*) >90 mL/min   GFR calc Af Amer 9 (*) >90 mL/min   Comment: (NOTE)     The eGFR has been calculated using the CKD EPI equation.     This calculation has not been validated in  all clinical situations.     eGFR's persistently <90 mL/min signify possible Chronic Kidney     Disease.    Dg Chest Port 1 View  05/19/2013   CLINICAL DATA:  Infiltrate.  EXAM: PORTABLE CHEST - 1 VIEW  COMPARISON:  DG CHEST 1V dated 05/18/2013  FINDINGS: Mediastinum and hilar structures normal. Subsegmental atelectasis lung bases. No focal alveolar infiltrate. No pleural effusion or pneumothorax. Cardiomegaly with normal pulmonary vascularity. No acute bony abnormality. Degenerative changes thoracic spine and both shoulders.  IMPRESSION: 1. Cardiomegaly, no CHF. 2. Mild bibasilar atelectasis.  No focal alveolar infiltrate.   Electronically Signed   By: Thomas  Register   On: 05/19/2013 15:21    Review of Systems  All other systems reviewed and are negative.   Blood pressure 148/75, pulse 87, temperature 97.4 F (36.3 C), temperature source Oral, resp. rate 17, height 6' (1.829 m), weight 79.1 kg (174 lb 6.1 oz), SpO2 93.00%. Physical Exam  On examination patient is left foot is neurovascularly   intact. He has pain with internal and external rotation of the left hip. I am unable to review the radiographs at this time they are from the [place] [facility] system and they are not accessible on the computer system at this time I will reviewed the radiographs prior to surgery. Assessment/Plan: Assessment: Left femoral neck fracture.  Plan: Will plan for left hip hemiarthroplasty. Risks and benefits were discussed including infection neurovascular injury pain arthritis dislocation and need for additional surgery. Patient states he understands and wished to proceed at this time.  DUDA,MARCUS V 05/19/2013, 9:07 PM      

## 2013-05-20 ENCOUNTER — Encounter (HOSPITAL_COMMUNITY)
Admission: AD | Disposition: A | Discharge status: Skilled Nursing Facility | Payer: Self-pay | Source: Other Acute Inpatient Hospital | Attending: Internal Medicine

## 2013-05-20 ENCOUNTER — Inpatient Hospital Stay (HOSPITAL_COMMUNITY): Payer: Medicare Other | Admitting: Anesthesiology

## 2013-05-20 ENCOUNTER — Inpatient Hospital Stay (HOSPITAL_COMMUNITY): Payer: Medicare Other

## 2013-05-20 ENCOUNTER — Encounter (HOSPITAL_COMMUNITY): Payer: Medicare Other | Admitting: Anesthesiology

## 2013-05-20 ENCOUNTER — Encounter (HOSPITAL_COMMUNITY): Payer: Self-pay | Admitting: Anesthesiology

## 2013-05-20 DIAGNOSIS — N19 Unspecified kidney failure: Secondary | ICD-10-CM

## 2013-05-20 HISTORY — PX: HIP ARTHROPLASTY: SHX981

## 2013-05-20 LAB — CBC
HCT: 29.4 % — ABNORMAL LOW (ref 39.0–52.0)
HEMOGLOBIN: 9.8 g/dL — AB (ref 13.0–17.0)
MCH: 32.9 pg (ref 26.0–34.0)
MCHC: 33.3 g/dL (ref 30.0–36.0)
MCV: 98.7 fL (ref 78.0–100.0)
Platelets: 176 10*3/uL (ref 150–400)
RBC: 2.98 MIL/uL — ABNORMAL LOW (ref 4.22–5.81)
RDW: 13.6 % (ref 11.5–15.5)
WBC: 7.6 10*3/uL (ref 4.0–10.5)

## 2013-05-20 LAB — SURGICAL PCR SCREEN
MRSA, PCR: NEGATIVE
Staphylococcus aureus: NEGATIVE

## 2013-05-20 LAB — RENAL FUNCTION PANEL
Albumin: 3 g/dL — ABNORMAL LOW (ref 3.5–5.2)
BUN: 69 mg/dL — ABNORMAL HIGH (ref 6–23)
CALCIUM: 9.2 mg/dL (ref 8.4–10.5)
CO2: 21 mEq/L (ref 19–32)
CREATININE: 5.88 mg/dL — AB (ref 0.50–1.35)
Chloride: 104 mEq/L (ref 96–112)
GFR calc non Af Amer: 8 mL/min — ABNORMAL LOW (ref >90)
GFR, EST AFRICAN AMERICAN: 9 mL/min — AB (ref >90)
GLUCOSE: 101 mg/dL — AB (ref 70–99)
Phosphorus: 6 mg/dL — ABNORMAL HIGH (ref 2.3–4.6)
Potassium: 4.2 mEq/L (ref 3.7–5.3)
SODIUM: 143 meq/L (ref 137–147)

## 2013-05-20 SURGERY — HEMIARTHROPLASTY, HIP, DIRECT ANTERIOR APPROACH, FOR FRACTURE
Anesthesia: General | Site: Hip | Laterality: Left

## 2013-05-20 MED ORDER — GLYCOPYRROLATE 0.2 MG/ML IJ SOLN
INTRAMUSCULAR | Status: DC | PRN
  Administered 2013-05-20: 0.6 mg via INTRAVENOUS

## 2013-05-20 MED ORDER — HYDROCODONE-ACETAMINOPHEN 5-325 MG PO TABS
ORAL_TABLET | ORAL | Status: AC
  Administered 2013-05-20: 2 via ORAL
  Filled 2013-05-20: qty 2

## 2013-05-20 MED ORDER — ONDANSETRON HCL 4 MG/2ML IJ SOLN: 4.0000 mg | Freq: Once | INTRAMUSCULAR | Status: DC | PRN

## 2013-05-20 MED ORDER — PHENYLEPHRINE HCL 10 MG/ML IJ SOLN
INTRAMUSCULAR | Status: DC | PRN
  Administered 2013-05-20: 80 ug via INTRAVENOUS
  Administered 2013-05-20: 120 ug via INTRAVENOUS
  Administered 2013-05-20: 200 ug via INTRAVENOUS
  Administered 2013-05-20: 120 ug via INTRAVENOUS
  Administered 2013-05-20: 80 ug via INTRAVENOUS
  Administered 2013-05-20 (×2): 120 ug via INTRAVENOUS
  Administered 2013-05-20: 80 ug via INTRAVENOUS

## 2013-05-20 MED ORDER — CEFAZOLIN SODIUM-DEXTROSE 2-3 GM-% IV SOLR
2.0000 g | INTRAVENOUS | Status: AC
  Administered 2013-05-20: 2 g via INTRAVENOUS
  Filled 2013-05-20: qty 50

## 2013-05-20 MED ORDER — ARTIFICIAL TEARS OP OINT
TOPICAL_OINTMENT | OPHTHALMIC | Status: DC | PRN
  Administered 2013-05-20: 1 via OPHTHALMIC

## 2013-05-20 MED ORDER — EPHEDRINE SULFATE 50 MG/ML IJ SOLN
INTRAMUSCULAR | Status: DC | PRN
  Administered 2013-05-20: 10 mg via INTRAVENOUS

## 2013-05-20 MED ORDER — ACETAMINOPHEN 325 MG PO TABS: 650.0000 mg | ORAL_TABLET | Freq: Four times a day (QID) | ORAL | Status: DC | PRN

## 2013-05-20 MED ORDER — 0.9 % SODIUM CHLORIDE (POUR BTL) OPTIME
TOPICAL | Status: DC | PRN
  Administered 2013-05-20: 1000 mL

## 2013-05-20 MED ORDER — NEOSTIGMINE METHYLSULFATE 1 MG/ML IJ SOLN
INTRAMUSCULAR | Status: DC | PRN
  Administered 2013-05-20: 4 mg via INTRAVENOUS

## 2013-05-20 MED ORDER — SODIUM CHLORIDE 0.9 % IV SOLN
INTRAVENOUS | Status: DC
  Administered 2013-05-23: 14:00:00 via INTRAVENOUS
  Administered 2013-05-23 – 2013-05-24 (×2): 20 mL/h via INTRAVENOUS

## 2013-05-20 MED ORDER — ONDANSETRON HCL 4 MG/2ML IJ SOLN: 4.0000 mg | Freq: Four times a day (QID) | INTRAMUSCULAR | Status: DC | PRN

## 2013-05-20 MED ORDER — ONDANSETRON HCL 4 MG/2ML IJ SOLN
INTRAMUSCULAR | Status: DC | PRN
Start: 2013-05-20 — End: 2013-05-20
  Administered 2013-05-20: 4 mg via INTRAVENOUS

## 2013-05-20 MED ORDER — ONDANSETRON HCL 4 MG PO TABS: 4.0000 mg | ORAL_TABLET | Freq: Four times a day (QID) | ORAL | Status: DC | PRN

## 2013-05-20 MED ORDER — PROPOFOL 10 MG/ML IV BOLUS
INTRAVENOUS | Status: DC | PRN
  Administered 2013-05-20: 125 mg via INTRAVENOUS

## 2013-05-20 MED ORDER — METOCLOPRAMIDE HCL 5 MG PO TABS: 5.0000 mg | ORAL_TABLET | Freq: Three times a day (TID) | ORAL | Status: DC | PRN

## 2013-05-20 MED ORDER — ACETAMINOPHEN 650 MG RE SUPP: 650.0000 mg | Freq: Four times a day (QID) | RECTAL | Status: DC | PRN

## 2013-05-20 MED ORDER — FENTANYL CITRATE 0.05 MG/ML IJ SOLN
INTRAMUSCULAR | Status: DC | PRN
  Administered 2013-05-20: 100 ug via INTRAVENOUS

## 2013-05-20 MED ORDER — ASPIRIN EC 325 MG PO TBEC: 325.0000 mg | DELAYED_RELEASE_TABLET | Freq: Every day | ORAL | Status: AC

## 2013-05-20 MED ORDER — FENTANYL CITRATE 0.05 MG/ML IJ SOLN
INTRAMUSCULAR | Status: AC
Start: 2013-05-20 — End: 2013-05-20
  Filled 2013-05-20: qty 5

## 2013-05-20 MED ORDER — OXYCODONE HCL 5 MG/5ML PO SOLN: 5.0000 mg | Freq: Once | ORAL | Status: DC | PRN

## 2013-05-20 MED ORDER — ROCURONIUM BROMIDE 100 MG/10ML IV SOLN
INTRAVENOUS | Status: DC | PRN
  Administered 2013-05-20: 40 mg via INTRAVENOUS

## 2013-05-20 MED ORDER — ONDANSETRON HCL 4 MG/2ML IJ SOLN
INTRAMUSCULAR | Status: AC
  Filled 2013-05-20: qty 2

## 2013-05-20 MED ORDER — METOCLOPRAMIDE HCL 5 MG/ML IJ SOLN: 5.0000 mg | Freq: Three times a day (TID) | INTRAMUSCULAR | Status: DC | PRN

## 2013-05-20 MED ORDER — GLYCOPYRROLATE 0.2 MG/ML IJ SOLN
INTRAMUSCULAR | Status: AC
  Filled 2013-05-20: qty 4

## 2013-05-20 MED ORDER — NEOSTIGMINE METHYLSULFATE 1 MG/ML IJ SOLN
INTRAMUSCULAR | Status: AC
  Filled 2013-05-20: qty 10

## 2013-05-20 MED ORDER — OXYCODONE HCL 5 MG PO TABS: 5.0000 mg | ORAL_TABLET | Freq: Once | ORAL | Status: DC | PRN

## 2013-05-20 MED ORDER — LIDOCAINE HCL (CARDIAC) 20 MG/ML IV SOLN
INTRAVENOUS | Status: DC | PRN
  Administered 2013-05-20: 80 mg via INTRAVENOUS

## 2013-05-20 MED ORDER — SODIUM CHLORIDE 0.9 % IV SOLN
INTRAVENOUS | Status: DC | PRN
  Administered 2013-05-20 (×2): via INTRAVENOUS

## 2013-05-20 MED ORDER — HYDROMORPHONE HCL PF 1 MG/ML IJ SOLN
INTRAMUSCULAR | Status: AC
  Administered 2013-05-20: 0.5 mg via INTRAVENOUS
  Filled 2013-05-20: qty 1

## 2013-05-20 MED ORDER — MENTHOL 3 MG MT LOZG
1.0000 | LOZENGE | OROMUCOSAL | Status: DC | PRN
  Filled 2013-05-20: qty 9

## 2013-05-20 MED ORDER — ASPIRIN EC 325 MG PO TBEC
325.0000 mg | DELAYED_RELEASE_TABLET | Freq: Every day | ORAL | Status: DC
  Administered 2013-05-22 – 2013-05-26 (×4): 325 mg via ORAL
  Filled 2013-05-20 (×7): qty 1

## 2013-05-20 MED ORDER — CEFAZOLIN SODIUM-DEXTROSE 2-3 GM-% IV SOLR
2.0000 g | Freq: Four times a day (QID) | INTRAVENOUS | Status: AC
  Administered 2013-05-20 (×2): 2 g via INTRAVENOUS
  Filled 2013-05-20 (×2): qty 50

## 2013-05-20 MED ORDER — HYDROCODONE-ACETAMINOPHEN 5-325 MG PO TABS
1.0000 | ORAL_TABLET | Freq: Four times a day (QID) | ORAL | Status: DC | PRN
  Administered 2013-05-21: 1 via ORAL
  Administered 2013-05-21 (×2): 2 via ORAL
  Administered 2013-05-23: 1 via ORAL
  Filled 2013-05-20: qty 1

## 2013-05-20 MED ORDER — ACETAMINOPHEN 500 MG PO TABS: 500.0000 mg | ORAL_TABLET | Freq: Four times a day (QID) | ORAL | Status: AC | PRN

## 2013-05-20 MED ORDER — PROPOFOL 10 MG/ML IV BOLUS
INTRAVENOUS | Status: AC
  Filled 2013-05-20: qty 20

## 2013-05-20 MED ORDER — MORPHINE SULFATE 2 MG/ML IJ SOLN
0.5000 mg | INTRAMUSCULAR | Status: DC | PRN
  Administered 2013-05-21: 0.5 mg via INTRAVENOUS

## 2013-05-20 MED ORDER — HYDROMORPHONE HCL PF 1 MG/ML IJ SOLN
0.2500 mg | INTRAMUSCULAR | Status: DC | PRN
  Administered 2013-05-20: 0.25 mg via INTRAVENOUS
  Administered 2013-05-20: 0.5 mg via INTRAVENOUS
  Administered 2013-05-20: 0.25 mg via INTRAVENOUS

## 2013-05-20 MED ORDER — PHENOL 1.4 % MT LIQD
1.0000 | OROMUCOSAL | Status: DC | PRN
  Filled 2013-05-20: qty 177

## 2013-05-20 SURGICAL SUPPLY — 57 items
BLADE SAW SAG 73X25 THK (BLADE) ×2
BLADE SAW SGTL 73X25 THK (BLADE) ×1 IMPLANT
BNDG COHESIVE 4X5 TAN STRL (GAUZE/BANDAGES/DRESSINGS) ×3 IMPLANT
BRUSH FEMORAL CANAL (MISCELLANEOUS) IMPLANT
CAP PRESSFIT BIPLR HP CP STDHD ×3 IMPLANT
COVER BACK TABLE 24X17X13 BIG (DRAPES) IMPLANT
COVER SURGICAL LIGHT HANDLE (MISCELLANEOUS) ×3 IMPLANT
DRAPE INCISE IOBAN 85X60 (DRAPES) ×6 IMPLANT
DRAPE ORTHO SPLIT 77X108 STRL (DRAPES) ×4
DRAPE SURG ORHT 6 SPLT 77X108 (DRAPES) ×2 IMPLANT
DRAPE U-SHAPE 47X51 STRL (DRAPES) ×3 IMPLANT
DRILL BIT 7/64X5 (BIT) IMPLANT
DRSG MEPILEX BORDER 4X12 (GAUZE/BANDAGES/DRESSINGS) ×3 IMPLANT
DRSG PAD ABDOMINAL 8X10 ST (GAUZE/BANDAGES/DRESSINGS) IMPLANT
DURAPREP 26ML APPLICATOR (WOUND CARE) ×3 IMPLANT
ELECT BLADE 6.5 EXT (BLADE) IMPLANT
ELECT CAUTERY BLADE 6.4 (BLADE) IMPLANT
ELECT REM PT RETURN 9FT ADLT (ELECTROSURGICAL) ×3
ELECTRODE REM PT RTRN 9FT ADLT (ELECTROSURGICAL) ×1 IMPLANT
EVACUATOR 1/8 PVC DRAIN (DRAIN) IMPLANT
GLOVE BIO SURGEON STRL SZ7 (GLOVE) ×3 IMPLANT
GLOVE BIOGEL PI IND STRL 7.5 (GLOVE) ×1 IMPLANT
GLOVE BIOGEL PI IND STRL 8 (GLOVE) ×1 IMPLANT
GLOVE BIOGEL PI IND STRL 9 (GLOVE) ×1 IMPLANT
GLOVE BIOGEL PI INDICATOR 7.5 (GLOVE) ×2
GLOVE BIOGEL PI INDICATOR 8 (GLOVE) ×2
GLOVE BIOGEL PI INDICATOR 9 (GLOVE) ×2
GLOVE ECLIPSE 7.0 STRL STRAW (GLOVE) ×3 IMPLANT
GLOVE SURG ORTHO 9.0 STRL STRW (GLOVE) ×3 IMPLANT
GLOVE SURG SS PI 8.0 STRL IVOR (GLOVE) ×3 IMPLANT
GOWN STRL REUS W/ TWL LRG LVL3 (GOWN DISPOSABLE) ×1 IMPLANT
GOWN STRL REUS W/ TWL XL LVL3 (GOWN DISPOSABLE) ×3 IMPLANT
GOWN STRL REUS W/TWL LRG LVL3 (GOWN DISPOSABLE) ×2
GOWN STRL REUS W/TWL XL LVL3 (GOWN DISPOSABLE) ×6
HANDPIECE INTERPULSE COAX TIP (DISPOSABLE)
IMMOBILIZER KNEE 22 UNIV (SOFTGOODS) ×3 IMPLANT
KIT BASIN OR (CUSTOM PROCEDURE TRAY) ×3 IMPLANT
KIT ROOM TURNOVER OR (KITS) ×3 IMPLANT
MANIFOLD NEPTUNE II (INSTRUMENTS) ×3 IMPLANT
NS IRRIG 1000ML POUR BTL (IV SOLUTION) ×3 IMPLANT
PACK TOTAL JOINT (CUSTOM PROCEDURE TRAY) ×3 IMPLANT
PAD ARMBOARD 7.5X6 YLW CONV (MISCELLANEOUS) ×6 IMPLANT
PAD CAST 4YDX4 CTTN HI CHSV (CAST SUPPLIES) ×1 IMPLANT
PADDING CAST COTTON 4X4 STRL (CAST SUPPLIES) ×2
SET HNDPC FAN SPRY TIP SCT (DISPOSABLE) IMPLANT
SPONGE GAUZE 4X4 12PLY (GAUZE/BANDAGES/DRESSINGS) IMPLANT
STAPLER VISISTAT 35W (STAPLE) ×3 IMPLANT
SUCTION FRAZIER TIP 10 FR DISP (SUCTIONS) ×3 IMPLANT
SUT ETHIBOND NAB CT1 #1 30IN (SUTURE) ×3 IMPLANT
SUT VIC AB 1 CT1 27 (SUTURE) ×2
SUT VIC AB 1 CT1 27XBRD ANBCTR (SUTURE) ×1 IMPLANT
SUT VIC AB 2-0 CTB1 (SUTURE) ×3 IMPLANT
TOWEL OR 17X24 6PK STRL BLUE (TOWEL DISPOSABLE) ×3 IMPLANT
TOWEL OR 17X26 10 PK STRL BLUE (TOWEL DISPOSABLE) ×3 IMPLANT
TOWER CARTRIDGE SMART MIX (DISPOSABLE) IMPLANT
TRAY FOLEY CATH 16FRSI W/METER (SET/KITS/TRAYS/PACK) IMPLANT
WATER STERILE IRR 1000ML POUR (IV SOLUTION) ×3 IMPLANT

## 2013-05-20 NOTE — Anesthesia Procedure Notes (Signed)
Procedure Name: Intubation Date/Time: 05/20/2013 8:01 AM Performed by: Carmela RimaMARTINELLI, Saudia Smyser F Pre-anesthesia Checklist: Patient identified, Timeout performed, Emergency Drugs available, Suction available and Patient being monitored Patient Re-evaluated:Patient Re-evaluated prior to inductionOxygen Delivery Method: Circle system utilized Preoxygenation: Pre-oxygenation with 100% oxygen Intubation Type: IV induction Ventilation: Mask ventilation without difficulty Laryngoscope Size: Mac and 3 Tube type: Oral Tube size: 7.5 mm Number of attempts: 2 Airway Equipment and Method: Video-laryngoscopy Placement Confirmation: ETT inserted through vocal cords under direct vision,  positive ETCO2 and breath sounds checked- equal and bilateral Secured at: 23 cm Tube secured with: Tape Dental Injury: Teeth and Oropharynx as per pre-operative assessment

## 2013-05-20 NOTE — Anesthesia Preprocedure Evaluation (Addendum)
Anesthesia Evaluation  Patient identified by MRN, date of birth, ID band Patient awake    Reviewed: Allergy & Precautions, H&P , NPO status , Patient's Chart, lab work & pertinent test results  Airway Mallampati: II TM Distance: >3 FB Neck ROM: full    Dental  (+) Teeth Intact, Dental Advidsory Given   Pulmonary  breath sounds clear to auscultation        Cardiovascular hypertension, On Medications Rhythm:Regular Rate:Normal     Neuro/Psych    GI/Hepatic   Endo/Other    Renal/GU CRFRenal disease     Musculoskeletal   Abdominal   Peds  Hematology   Anesthesia Other Findings   Reproductive/Obstetrics                          Anesthesia Physical Anesthesia Plan  ASA: III  Anesthesia Plan: General   Post-op Pain Management:    Induction: Intravenous  Airway Management Planned: Oral ETT  Additional Equipment:   Intra-op Plan:   Post-operative Plan: Extubation in OR  Informed Consent: I have reviewed the patients History and Physical, chart, labs and discussed the procedure including the risks, benefits and alternatives for the proposed anesthesia with the patient or authorized representative who has indicated his/her understanding and acceptance.   Dental Advisory Given and Dental advisory given  Plan Discussed with: Anesthesiologist, CRNA and Surgeon  Anesthesia Plan Comments:        Anesthesia Quick Evaluation

## 2013-05-20 NOTE — Op Note (Signed)
OPERATIVE REPORT  DATE OF SURGERY: 05/20/2013  PATIENT:  Robert Weber,  80 y.o. male  PRE-OPERATIVE DIAGNOSIS:  Left Femoral Neck Fracture  POST-OPERATIVE DIAGNOSIS:  Left Femoral Neck Fracture  PROCEDURE:  Procedure(s): LEFT HIP HEMIARTHROPLASTY Zimmer components. Size 59 had. Size 15 stem.  SURGEON:  Surgeon(s): Nadara MustardMarcus V Chanae Gemma, MD  ANESTHESIA:   general  EBL:  Minimal ML  SPECIMEN:  No Specimen  TOURNIQUET:  * No tourniquets in log *  PROCEDURE DETAILS: Patient is a 80 year old gentleman who fell sustaining a left femoral neck fracture. Patient was medically evaluated and felt to be stable for surgical intervention and presents at this time for left hip hemiarthroplasty. Risks and benefits were discussed including infection neurovascular injury pain DVT dislocation and need for additional surgery. Patient states he understands and wished to proceed at this time. Description of procedure patient was brought to the operating room and underwent a general anesthetic. After adequate anesthesia obtained patient was placed in the right lateral decubitus position with the left side up and the left lower extremity was prepped using DuraPrep and draped into a sterile field. Patient also had some ecchymosis and bruising on the left leg from his fall and this was wrapped with a compressive wrap. The skin was covered with Ioban dressing. A posterior lateral incision was made this was carried down through the tensor fascia lata which was split. The piriformis and short external rotators and capsule were incised off the femoral neck. The hip was dislocated the femoral neck cut was made 1 cm proximal to the calcar. The femoral canal was sequentially broached to a size 15 stem. The head measured 59 this had a good suction fit. The wound was irrigated with normal saline. The 15 stem and 59 head were inserted this had good range of motion and was stable. The capsule short external rotators and piriformis were  reapproximated using Ethibond suture. The tensor fascia lata was closed using #1 Vicryl subcutaneous is closed using 0 Vicryl the skin was closed using staples. The wound was covered with Mepilex dressing. Patient was extubated taken to the PACU in stable condition.  PLAN OF CARE: Admit to inpatient   PATIENT DISPOSITION:  PACU - hemodynamically stable.   Nadara MustardUDA,Maty Zeisler V, MD 05/20/2013 9:03 AM

## 2013-05-20 NOTE — Progress Notes (Signed)
Pt's dsg changed to left hip x2 since arrival back to unit room. Pt arrived at 1105 to room with small-moderate amount of blood visible through mepilex dressing . At 1330 entire dressing had to be changed due to saturation of entire dressing along with large amount on bed pad and and sheets. Blood had coagulated/gel'd on the mepilex dressing. Area cleaned, bleeding noted to be from distal end of incision where it continued to have a  trickle of blood coming from it. Pressure held, new gauze folded 4x4's applied x3 packs to entire incision and an ABD pad used to cover incision and secured with tape.  At 1600 dressings saturated again and through to pad. Dressings changed again/reinforced by another RN. And call placed by this RN to ortho MD on call. Dr. Roda ShuttersXu made aware, spoke with OR circ nurse with Dr Roda ShuttersXu, instructed to continue to reinforce and Dr Roda ShuttersXu will call back when his case complete.  Pt informed of plan.

## 2013-05-20 NOTE — Anesthesia Postprocedure Evaluation (Signed)
  Anesthesia Post-op Note  Patient: Robert Weber  Procedure(s) Performed: Procedure(s): LEFT HIP HEMIARTHROPLASTY (Left)  Patient Location: PACU  Anesthesia Type:General  Level of Consciousness: awake, alert  and oriented  Airway and Oxygen Therapy: Patient Spontanous Breathing and Patient connected to nasal cannula oxygen  Post-op Pain: mild  Post-op Assessment: Post-op Vital signs reviewed  Post-op Vital Signs: Reviewed  Complications: No apparent anesthesia complications

## 2013-05-20 NOTE — Clinical Social Work Psychosocial (Signed)
Assessment note reviewed and approved.  French Kendra, MSW, LCSW [phone] 

## 2013-05-20 NOTE — Progress Notes (Signed)
[facility] PROGRESS NOTE  Robert Weber ZOX:[id number] DOB: 07/21/1931 DOA: 05/18/2013 PCP: No primary provider on file.  Assessment/Plan: Principal Problem:  Fracture of femoral neck, left  -Reconsulted ortho 4/2 Dr Duda>> taken to OR this a.m. and status post left hemiarthroplasty -appreciate Dr Audrie Liauda's assisatance -continue pain mangement Active Problems:  CKD (chronic kidney disease) stage 5, GFR less than 15 ml/min -Appreciate renal assistance, renal function remained stable>> follow.     Code Status: full Family Communication: none at bedside  Disposition Plan: likely will need SNF   Consultants:  ORTHO- Dr Lajoyce Cornersuda  renal  Procedures:  none  Antibiotics:  none   HPI/Subjective: Patient seen status post surgery and denies any complaints.  Objective: Filed Vitals:   05/20/13 1106  BP: 120/66  Pulse: 94  Temp: 98.2 F (36.8 C)  Resp: 14    Intake/Output Summary (Last 24 hours) at 05/20/13 1513 Last data filed at 05/20/13 1126  Gross per 24 hour  Intake   1140 ml  Output    800 ml  Net    340 ml   Filed Weights   05/18/13 2138 05/19/13 2025  Weight: 80.015 kg (176 lb 6.4 oz) 79.1 kg (174 lb 6.1 oz)    Exam:  General: alert & oriented x 3 In NAD Cardiovascular: RRR, nl S1 s2 Respiratory: CTAB Abdomen: soft +BS NT/ND, no masses palpable Extremities: No cyanosis and no edema     Data Reviewed: Basic Metabolic Panel:  Recent Labs Lab 05/19/13 0716 05/20/13 0550  NA 146 143  K 4.7 4.2  CL 108 104  CO2 16* 21  GLUCOSE 110* 101*  BUN 71* 69*  CREATININE 6.10* 5.88*  CALCIUM 9.3 9.2  PHOS  --  6.0*   Liver Function Tests:  Recent Labs Lab 05/20/13 0550  ALBUMIN 3.0*   No results found for this basename: LIPASE, AMYLASE,  in the last 168 hours No results found for this basename: AMMONIA,  in the last 168 hours CBC:  Recent Labs Lab 05/19/13 0716 05/20/13 0550  WBC 7.2 7.6  HGB 10.4* 9.8*  HCT 30.7* 29.4*  MCV 97.2 98.7   PLT 197 176   Cardiac Enzymes: No results found for this basename: CKTOTAL, CKMB, CKMBINDEX, TROPONINI,  in the last 168 hours BNP (last 3 results) No results found for this basename: PROBNP,  in the last 8760 hours CBG: No results found for this basename: GLUCAP,  in the last 168 hours  Recent Results (from the past 240 hour(s))  SURGICAL PCR SCREEN     Status: None   Collection Time    05/20/13  6:00 AM      Result Value Ref Range Status   MRSA, PCR NEGATIVE  NEGATIVE Final   Staphylococcus aureus NEGATIVE  NEGATIVE Final   Comment:            The Xpert SA Assay (FDA     approved for NASAL specimens     in patients over 78 years of age),     is one component of     a comprehensive surveillance     program.  Test performance has     been validated by [facility] for patients greater     than or equal to 78 year old.     It is not intended     to diagnose infection nor to     guide or monitor treatment.     Studies: Dg Pelvis Portable  05/20/2013   CLINICAL DATA:  Postop left hip  EXAM: PORTABLE PELVIS 1-2 VIEWS  COMPARISON:  DG HIP COMPLETE 2+V BILAT dated 05/18/2013  FINDINGS: Two spot AP projection radiographs of the pelvis are provided for review.  Images demonstrate the sequela of left bipolar hip replacement. Alignment appears near anatomic. No fracture or dislocation given anterior projection. There is expected subcutaneous emphysema about the operative site. Skin staples overlie the upper outer aspect of the left thigh. No radiopaque foreign body.  IMPRESSION: Post left bipolar hip replacement without evidence of complication.   Electronically Signed   By: Simonne Come M.D.   On: 05/20/2013 10:07   Dg Chest Port 1 View  05/19/2013   CLINICAL DATA:  Infiltrate.  EXAM: PORTABLE CHEST - 1 VIEW  COMPARISON:  DG CHEST 1V dated 05/18/2013  FINDINGS: Mediastinum and hilar structures normal. Subsegmental atelectasis lung bases. No focal alveolar infiltrate. No pleural effusion or  pneumothorax. Cardiomegaly with normal pulmonary vascularity. No acute bony abnormality. Degenerative changes thoracic spine and both shoulders.  IMPRESSION: 1. Cardiomegaly, no CHF. 2. Mild bibasilar atelectasis.  No focal alveolar infiltrate.   Electronically Signed   By: Maisie Fus  Register   On: 05/19/2013 15:21    Scheduled Meds: . amLODipine  10 mg Oral Daily  . aspirin EC  325 mg Oral Daily  . [START ON 05/21/2013] aspirin EC  325 mg Oral Q breakfast  . calcitRIOL  0.25 mcg Oral Daily  .  ceFAZolin (ANCEF) IV  2 g Intravenous Q6H  . levothyroxine  100 mcg Oral QAC breakfast  . simvastatin  20 mg Oral q1800   Continuous Infusions: . sodium chloride    .  sodium bicarbonate 150 mEq in sterile water 1000 mL infusion 50 mL/hr at 05/20/13 1120    Principal Problem:   Fracture of femoral neck, left Active Problems:   CKD (chronic kidney disease) stage 5, GFR less than 15 ml/min   Hip fracture    Time spent: 25    [facility]  [facility] Pager [phone]. If 7PM-7AM, please contact night-coverage at [email or web address], password [facility] 05/20/2013, 3:13 PM  LOS: 2 days

## 2013-05-20 NOTE — Interval H&P Note (Signed)
History and Physical Interval Note:  05/20/2013 6:00 AM  Robert Weber  has presented today for surgery, with the diagnosis of Left Femoral Neck Fracture  The various methods of treatment have been discussed with the patient and family. After consideration of risks, benefits and other options for treatment, the patient has consented to  Procedure(s) with comments: ARTHROPLASTY BIPOLAR HIP (Left) - Left Hip Hemiarthroplasty as a surgical intervention .  The patient's history has been reviewed, patient examined, no change in status, stable for surgery.  I have reviewed the patient's chart and labs.  Questions were answered to the patient's satisfaction.     DUDA,MARCUS V

## 2013-05-20 NOTE — Clinical Social Work Placement (Signed)
Clinical Social Work Department CLINICAL SOCIAL WORK PLACEMENT NOTE 05/20/2013  Patient:  Robert Weber,Robert Weber  Account Number:  [id number] Admit date:  05/18/2013  Clinical Social Worker:  Genelle BalVANESSA Neils [place], LCSW  Date/time:  05/20/2013 05:21 AM  Clinical Social Work is seeking post-discharge placement for this patient at the following level of care:   SKILLED NURSING   (*CSW will update this form in Epic as items are completed)     Patient/family provided with [facility] Department of Clinical Social Work's list of facilities offering this level of care within the geographic area requested by the patient (or if unable, by the patient's family).  05/19/2013  Patient/family informed of their freedom to choose among providers that offer the needed level of care, that participate in Medicare, Medicaid or managed care program needed by the patient, have an available bed and are willing to accept the patient.    Patient/family informed of [facility]' ownership interest in [facility], as well as of the fact that they are under no obligation to receive care at this facility.  PASARR submitted to EDS on 05/20/2013 PASARR number received from EDS on 05/20/2013  FL2 transmitted to all facilities in geographic area requested by pt/family on  05/20/2013 [facility] FL2 transmitted to all facilities within larger geographic area on 05/20/2013[facility]  Patient informed that his/her managed care company has contracts with or will negotiate with  certain facilities, including the following:     Patient/family informed of bed offers received:   Patient chooses bed at  Physician recommends and patient chooses bed at    Patient to be transferred to  on   Patient to be transferred to facility by   The following physician request were entered in Epic:   Additional CommentsAlvina Chou:  V. Leeland Lovelady, LCSW [phone]

## 2013-05-20 NOTE — Transfer of Care (Signed)
Immediate Anesthesia Transfer of Care Note  Patient: Robert Weber  Procedure(s) Performed: Procedure(s): LEFT HIP HEMIARTHROPLASTY (Left)  Patient Location: PACU  Anesthesia Type:General  Level of Consciousness: awake, alert  and oriented  Airway & Oxygen Therapy: Patient Spontanous Breathing and Patient connected to face mask oxygen  Post-op Assessment: Report given to PACU RN, Post -op Vital signs reviewed and stable and Patient moving all extremities X 4  Post vital signs: Reviewed and stable  Complications: No apparent anesthesia complications

## 2013-05-20 NOTE — H&P (View-Only) (Signed)
Reason for Consult: Left femoral neck fracture. Referring Physician: Kenyetta Fife is an 80 y.o. male.  HPI: Patient is a 80 year old gentleman with end-stage renal disease hypertension who fell on his left hip sustaining a left femoral neck fracture. Patient was transferred here from [facility] for evaluation and treatment.  Past Medical History  Diagnosis Date  . CKD (chronic kidney disease) stage 5, GFR less than 15 ml/min   . HTN (hypertension)   . High cholesterol     History reviewed. No pertinent past surgical history.  History reviewed. No pertinent family history.  Social History:  reports that he has never smoked. He does not have any smokeless tobacco history on file. He reports that he does not drink alcohol or use illicit drugs.  Allergies: No Known Allergies  Medications: I have reviewed the patient's current medications.  Results for orders placed during the hospital encounter of 05/18/13 (from the past 48 hour(s))  CBC     Status: Abnormal   Collection Time    05/19/13  7:16 AM      Result Value Ref Range   WBC 7.2  4.0 - 10.5 K/uL   RBC 3.16 (*) 4.22 - 5.81 MIL/uL   Hemoglobin 10.4 (*) 13.0 - 17.0 g/dL   HCT 30.7 (*) 39.0 - 52.0 %   MCV 97.2  78.0 - 100.0 fL   MCH 32.9  26.0 - 34.0 pg   MCHC 33.9  30.0 - 36.0 g/dL   RDW 13.4  11.5 - 15.5 %   Platelets 197  150 - 400 K/uL  BASIC METABOLIC PANEL     Status: Abnormal   Collection Time    05/19/13  7:16 AM      Result Value Ref Range   Sodium 146  137 - 147 mEq/L   Potassium 4.7  3.7 - 5.3 mEq/L   Chloride 108  96 - 112 mEq/L   CO2 16 (*) 19 - 32 mEq/L   Glucose, Bld 110 (*) 70 - 99 mg/dL   BUN 71 (*) 6 - 23 mg/dL   Creatinine, Ser 6.10 (*) 0.50 - 1.35 mg/dL   Calcium 9.3  8.4 - 10.5 mg/dL   GFR calc non Af Amer 8 (*) >90 mL/min   GFR calc Af Amer 9 (*) >90 mL/min   Comment: (NOTE)     The eGFR has been calculated using the CKD EPI equation.     This calculation has not been validated in  all clinical situations.     eGFR's persistently <90 mL/min signify possible Chronic Kidney     Disease.    Dg Chest Port 1 View  05/19/2013   CLINICAL DATA:  Infiltrate.  EXAM: PORTABLE CHEST - 1 VIEW  COMPARISON:  DG CHEST 1V dated 05/18/2013  FINDINGS: Mediastinum and hilar structures normal. Subsegmental atelectasis lung bases. No focal alveolar infiltrate. No pleural effusion or pneumothorax. Cardiomegaly with normal pulmonary vascularity. No acute bony abnormality. Degenerative changes thoracic spine and both shoulders.  IMPRESSION: 1. Cardiomegaly, no CHF. 2. Mild bibasilar atelectasis.  No focal alveolar infiltrate.   Electronically Signed   By: Marcello Moores  Register   On: 05/19/2013 15:21    Review of Systems  All other systems reviewed and are negative.   Blood pressure 148/75, pulse 87, temperature 97.4 F (36.3 C), temperature source Oral, resp. rate 17, height 6' (1.829 m), weight 79.1 kg (174 lb 6.1 oz), SpO2 93.00%. Physical Exam  On examination patient is left foot is neurovascularly  intact. He has pain with internal and external rotation of the left hip. I am unable to review the radiographs at this time they are from the [facility] system and they are not accessible on the computer system at this time I will reviewed the radiographs prior to surgery. Assessment/Plan: Assessment: Left femoral neck fracture.  Plan: Will plan for left hip hemiarthroplasty. Risks and benefits were discussed including infection neurovascular injury pain arthritis dislocation and need for additional surgery. Patient states he understands and wished to proceed at this time.  Waller Marcussen V 05/19/2013, 9:07 PM

## 2013-05-20 NOTE — Progress Notes (Signed)
Subjective:  Just back from left total hip Animated - says "I feel great!!! Preop creatinine 5.88 (stable)  Objective Vital signs in last 24 hours: Filed Vitals:   05/20/13 1015 05/20/13 1030 05/20/13 1045 05/20/13 1106  BP: 139/71 128/70 132/70 120/66  Pulse: 86 87 101 94  Temp:   98.6 F (37 C) 98.2 F (36.8 C)  TempSrc:    Oral  Resp: 13 15 12 14   Height:      Weight:      SpO2: 97% 96% 94% 94%   Weight change: -0.915 kg (-2 lb 0.3 oz)  Intake/Output Summary (Last 24 hours) at 05/20/13 1226 Last data filed at 05/20/13 1126  Gross per 24 hour  Intake   1380 ml  Output   1250 ml  Net    130 ml   Physical Exam:  Blood pressure 120/66, pulse 94, temperature 98.2 F (36.8 C), temperature source Oral, resp. rate 14, height 6' (1.829 m), weight 79.1 kg (174 lb 6.1 oz), SpO2 94.00%. General appearance: alert, NAD Delightful older WM!! Head: Normocephalic, without obvious abnormality, atraumatic  Neck:  no JVD Resp: clear without crackles or wheezes Cardio: regular rate and rhythm, S1, S2 normal, no murmur, click, rub or gallop  GI: soft, non-tender; bowel sounds normal; no masses, no organomegaly  Extremities dry dressings left hip; left knee in immobilizer and left foot ace wrapped; no RLE edema  Labs: Basic Metabolic Panel:  Recent Labs Lab 05/19/13 0716 05/20/13 0550  NA 146 143  K 4.7 4.2  CL 108 104  CO2 16* 21  GLUCOSE 110* 101*  BUN 71* 69*  CREATININE 6.10* 5.88*  CALCIUM 9.3 9.2  PHOS  --  6.0*    Recent Labs Lab 05/20/13 0550  ALBUMIN 3.0*    Recent Labs Lab 05/19/13 0716 05/20/13 0550  WBC 7.2 7.6  HGB 10.4* 9.8*  HCT 30.7* 29.4*  MCV 97.2 98.7  PLT 197 176   Studies/Results: Dg Pelvis Portable  05/20/2013   CLINICAL DATA:  Postop left hip  EXAM: PORTABLE PELVIS 1-2 VIEWS  COMPARISON:  DG HIP COMPLETE 2+V BILAT dated 05/18/2013  FINDINGS: Two spot AP projection radiographs of the pelvis are provided for review.  Images demonstrate the  sequela of left bipolar hip replacement. Alignment appears near anatomic. No fracture or dislocation given anterior projection. There is expected subcutaneous emphysema about the operative site. Skin staples overlie the upper outer aspect of the left thigh. No radiopaque foreign body.  IMPRESSION: Post left bipolar hip replacement without evidence of complication.   Electronically Signed   By: Simonne Come M.D.   On: 05/20/2013 10:07   Dg Chest Port 1 View  05/19/2013   CLINICAL DATA:  Infiltrate.  EXAM: PORTABLE CHEST - 1 VIEW  COMPARISON:  DG CHEST 1V dated 05/18/2013  FINDINGS: Mediastinum and hilar structures normal. Subsegmental atelectasis lung bases. No focal alveolar infiltrate. No pleural effusion or pneumothorax. Cardiomegaly with normal pulmonary vascularity. No acute bony abnormality. Degenerative changes thoracic spine and both shoulders.  IMPRESSION: 1. Cardiomegaly, no CHF. 2. Mild bibasilar atelectasis.  No focal alveolar infiltrate.   Electronically Signed   By: Maisie Fus  Register   On: 05/19/2013 15:21   Medications: . sodium chloride    .  sodium bicarbonate 150 mEq in sterile water 1000 mL infusion 50 mL/hr at 05/20/13 1120   . amLODipine  10 mg Oral Daily  . aspirin EC  325 mg Oral Daily  . [START ON 05/21/2013] aspirin EC  325 mg Oral Q breakfast  . calcitRIOL  0.25 mcg Oral Daily  .  ceFAZolin (ANCEF) IV  2 g Intravenous Q6H  . levothyroxine  100 mcg Oral QAC breakfast  . simvastatin  20 mg Oral q1800    SUMMARY IMPRESSION 80yo WM with PMH sig for HTN, hypothyroidism, and CKD stage 4 [Scr running 5.7 (4/14) to 5.9 in (10/14)] who was admitted after a fall at home complicated by L hip fracture. He was admitted for orthopedic surgical evaluation with plans for repair. We were asked to see for creatinine of 6.1.    Assessment/Plan:  1. CKD stage 4/5- no significant change in overall renal function and no signs/symptoms of uremia. Will follow closely in the peri/post-operative  period. Avoid NSAIDs/COX-II I's, and IV Contrast. 2. Left hip fracture- s/p L ORIF today by Dr. Lajoyce Cornersuda 3. Vascular access- his L AVF was clotted in October. Will ask VVS to evaluate for new access. Hopefully he will not require the initiation of dialysis during this hospitalization, however his CKD has been progressive over the last year. 4. Anemia of chronic disease as well as ABLA from trauma. Follow H/H. Will check iron stores and may need transfusion and/or initiation of EPO.Hgb was 11.7 on 04/29/13 5. Metabolic Acidosis- acidosis looks better with slow IV isotonic bicarb at 50/hour 6. HTN- cont with meds. 7. Secondary HPT - continue current clcitriol  Camille Balynthia Fara Worthy, MD [facility] [phone] pager 05/20/2013, 12:26 PM

## 2013-05-20 NOTE — Progress Notes (Signed)
VASCULAR LAB PRELIMINARY  PRELIMINARY  PRELIMINARY  PRELIMINARY  Right  Upper Extremity Vein Map    Cephalic  Segment Diameter Depth Comment  1. Axilla 2.5 mm mm   2. Mid upper arm 1.8 mm mm   3. Above AC 2.6 mm mm   4. In AC mm mm IV  5. Below AC 2.5 mm mm   6. Mid forearm 1.9 mm mm Branch   7. Wrist 1.4 mm mm    mm mm    mm mm    mm mm    Basilic  Segment Diameter Depth Comment  1. Mid upper arm 3.5 mm 6 mm   2. Above AC 3 mm 5.1 mm   3. In AC 2.1 mm 5.2 mm Branch                    Left Upper Extremity Vein Map    Cephalic  Segment Diameter Depth Comment  1. Axilla mm mm Not visualized  2. Mid upper arm mm mm Not visualized  3. Above AC mm mm Not visualized  4. In AC mm mm Not visualized  5. Below AC 2.1 mm mm Partial thrombosis  6. Mid forearm 1.8 mm mm Partial thrombosis  7. Wrist mm mm IV   mm mm    mm mm    mm mm    Basilic  Segment Diameter Depth Comment  1. Mid upper arm 3.5 mm 7.2 mm   2. Above AC 3.2 mm 6.1 mm   3. [facility] mm  1.8 mm Branch          Azariyah Luhrs, RVT 05/20/2013, 3:56 PM

## 2013-05-20 NOTE — Progress Notes (Signed)
Patient stated he has not voided since this morning prior to surgery, bladder scan showed 579 mL.  Benedetto Coons. Callahan, NP notified. 475 mL out via I&O cath per MD order.  Will continue to monitor.

## 2013-05-21 DIAGNOSIS — D62 Acute posthemorrhagic anemia: Secondary | ICD-10-CM

## 2013-05-21 LAB — CBC
HCT: 20.4 % — ABNORMAL LOW (ref 39.0–52.0)
HEMOGLOBIN: 6.8 g/dL — AB (ref 13.0–17.0)
MCH: 32.4 pg (ref 26.0–34.0)
MCHC: 33.3 g/dL (ref 30.0–36.0)
MCV: 97.1 fL (ref 78.0–100.0)
Platelets: 179 10*3/uL (ref 150–400)
RBC: 2.1 MIL/uL — ABNORMAL LOW (ref 4.22–5.81)
RDW: 13.5 % (ref 11.5–15.5)
WBC: 8.7 10*3/uL (ref 4.0–10.5)

## 2013-05-21 LAB — IRON AND TIBC: UIBC: 182 ug/dL (ref 125–400)

## 2013-05-21 LAB — RENAL FUNCTION PANEL
Albumin: 2.4 g/dL — ABNORMAL LOW (ref 3.5–5.2)
BUN: 75 mg/dL — ABNORMAL HIGH (ref 6–23)
CO2: 22 meq/L (ref 19–32)
Calcium: 8.1 mg/dL — ABNORMAL LOW (ref 8.4–10.5)
Chloride: 104 mEq/L (ref 96–112)
Creatinine, Ser: 6.61 mg/dL — ABNORMAL HIGH (ref 0.50–1.35)
GFR calc Af Amer: 8 mL/min — ABNORMAL LOW (ref >90)
GFR, EST NON AFRICAN AMERICAN: 7 mL/min — AB (ref >90)
Glucose, Bld: 169 mg/dL — ABNORMAL HIGH (ref 70–99)
PHOSPHORUS: 6.6 mg/dL — AB (ref 2.3–4.6)
POTASSIUM: 4.2 meq/L (ref 3.7–5.3)
Sodium: 145 mEq/L (ref 137–147)

## 2013-05-21 LAB — PREPARE RBC (CROSSMATCH)

## 2013-05-21 LAB — FERRITIN: Ferritin: 149 ng/mL (ref 22–322)

## 2013-05-21 LAB — ABO/RH: ABO/RH(D): O POS

## 2013-05-21 NOTE — Progress Notes (Signed)
[facility] ROUNDING NOTE  SUMMARY IMPRESSION 81yo WM with PMH sig for HTN, hypothyroidism, and CKD stage 4 [Scr running 5.7 (4/14) to 5.9 in (10/14)] who was admitted after a fall at home complicated by L hip fracture. He was admitted for orthopedic surgical evaluation/ repair. We were asked to see for CKD related issues.    Assessment/Plan:  1. CKD stage 4/5- creatinine up 5.8->6.6.  BP soft, likely due to drop in Hb to 6.8. Says making less urine.  Will continue to follow closely. No dialysis indications at this time.  No access.  2. Left hip fracture- s/p L ORIF today by Dr. Lajoyce Cornersuda 3. Vascular access- his L AVF was clotted in October. Will ask VVS to evaluate for new access. Hopefully he will not require the initiation of dialysis during this hospitalization, however his CKD has been progressive over the last year. 4. Anemia of chronic disease as well as ABLA from trauma. Follow H/H. Will check iron stores and may need transfusion and/or initiation of EPO.Hgb was 11.7 on 04/29/13.  Has dropped to 6.8 today. For transfusion. 5. Metabolic Acidosis- acidosis looks better with slow IV isotonic bicarb at 50/hour. Continue for now 6. HTN- cont with meds. 7. Secondary HPT - continue current calcitriol  Subjective:  Doesn't feel as well today. Weak. Preop creatinine 5.88 (BL) up to 6.6 K is fine so far  Objective Vital signs in last 24 hours: Filed Vitals:   05/20/13 2057 05/21/13 0358 05/21/13 0852 05/21/13 1200  BP: 103/56 101/63 94/57   Pulse: 102 105 104   Temp: 99 F (37.2 C) 99.1 F (37.3 C) 99.6 F (37.6 C)   TempSrc:   Oral   Resp: 17 17 16 16   Height:      Weight:      SpO2: 95% 92% 94% 96%   Weight change: 4.4 kg (9 lb 11.2 oz)  Intake/Output Summary (Last 24 hours) at 05/21/13 1317 Last data filed at 05/21/13 0852  Gross per 24 hour  Intake    480 ml  Output    725 ml  Net   -245 ml   Physical Exam:  Blood pressure 120/66, pulse 94, temperature 98.2 F  (36.8 C), temperature source Oral, resp. rate 14, height 6' (1.829 m), weight 79.1 kg (174 lb 6.1 oz), SpO2 94.00%. General appearance: alert, NAD  Head: Normocephalic, without obvious abnormality, atraumatic  Neck:  no JVD Resp: clear without crackles or wheezes Cardio: regular rate and rhythm, S1, S2 normal, no murmur, click, rub or gallop  GI: soft, non-tender; bowel sounds normal; no masses, no organomegaly  Extremities dry dressings left hip; left knee in immobilizer and left foot ace wrapped; no RLE edema  Labs: Basic Metabolic Panel:  Recent Labs Lab 05/19/13 0716 05/20/13 0550 05/21/13 0430  NA 146 143 145  K 4.7 4.2 4.2  CL 108 104 104  CO2 16* 21 22  GLUCOSE 110* 101* 169*  BUN 71* 69* 75*  CREATININE 6.10* 5.88* 6.61*  CALCIUM 9.3 9.2 8.1*  PHOS  --  6.0* 6.6*    Recent Labs Lab 05/20/13 0550 05/21/13 0430  ALBUMIN 3.0* 2.4*    Recent Labs Lab 05/19/13 0716 05/20/13 0550 05/21/13 0430  WBC 7.2 7.6 8.7  HGB 10.4* 9.8* 6.8*  HCT 30.7* 29.4* 20.4*  MCV 97.2 98.7 97.1  PLT 197 176 179   Studies/Results: Dg Pelvis Portable  05/20/2013   CLINICAL DATA:  Postop left hip  EXAM: PORTABLE PELVIS 1-2 VIEWS  COMPARISON:  DG HIP COMPLETE 2+V BILAT dated 05/18/2013  FINDINGS: Two spot AP projection radiographs of the pelvis are provided for review.  Images demonstrate the sequela of left bipolar hip replacement. Alignment appears near anatomic. No fracture or dislocation given anterior projection. There is expected subcutaneous emphysema about the operative site. Skin staples overlie the upper outer aspect of the left thigh. No radiopaque foreign body.  IMPRESSION: Post left bipolar hip replacement without evidence of complication.   Electronically Signed   By: Simonne Come M.D.   On: 05/20/2013 10:07   Dg Chest Port 1 View  05/19/2013   CLINICAL DATA:  Infiltrate.  EXAM: PORTABLE CHEST - 1 VIEW  COMPARISON:  DG CHEST 1V dated 05/18/2013  FINDINGS: Mediastinum and hilar  structures normal. Subsegmental atelectasis lung bases. No focal alveolar infiltrate. No pleural effusion or pneumothorax. Cardiomegaly with normal pulmonary vascularity. No acute bony abnormality. Degenerative changes thoracic spine and both shoulders.  IMPRESSION: 1. Cardiomegaly, no CHF. 2. Mild bibasilar atelectasis.  No focal alveolar infiltrate.   Electronically Signed   By: Maisie Fus  Register   On: 05/19/2013 15:21   Medications: . sodium chloride    .  sodium bicarbonate 150 mEq in sterile water 1000 mL infusion 50 mL/hr at 05/20/13 1917   . amLODipine  10 mg Oral Daily  . aspirin EC  325 mg Oral Q breakfast  . calcitRIOL  0.25 mcg Oral Daily  . levothyroxine  100 mcg Oral QAC breakfast  . simvastatin  20 mg Oral q1800    Camille Bal, MD [facility] [phone] pager 05/21/2013, 1:17 PM

## 2013-05-21 NOTE — Progress Notes (Signed)
Pt unable to void, bladder scanned showed . T.Callahan,NP made aware, order given to In/out cath. Pt had urine output of per In/out catheter, tolerated well. Will continue to monitor.

## 2013-05-21 NOTE — Progress Notes (Signed)
Pt has not voided in 12 hours. Pt reports not feeling pressure. Bladder not distended. Bladder scan showed 105 cc. Dr. Suanne MarkerViyuoh paged. Continue to monitor. Hortencia ConradiWendi Burnham Trost, RN

## 2013-05-21 NOTE — Progress Notes (Signed)
Spoke to on call orthopedic surgeon, made aware that the patient's dressing and bed pad has become saturated multiple times during shift, no new orders given. Still reinforcing dressing, will continue to monitor patient.

## 2013-05-21 NOTE — Progress Notes (Signed)
CRITICAL VALUE ALERT  Critical value received: hemoglobin 6.8  Date of notification: 05/21/13  Time of notification: 0650  Critical value read back:yes  Nurse who received alert: Mickey Farberenea Samone Guhl  MD notified (1st page): Dr.XU, Etter SjogrenNAIPING MICHAEL  Time of first page:0655  MD notified (2nd page):VIYUOH,ADELINE C   Time of second page:0705  Responding MD:Dr.Viyuoh  Time MD responded:0713

## 2013-05-21 NOTE — Progress Notes (Signed)
OT Cancellation Note  Patient Details Name: Army Fossara Hengel MRN: [id number] DOB: 05/01/1931   Cancelled Treatment:    Reason Eval/Treat Not Completed: Other (comment). Per PT, pt with dizziness and nearly passed out upon sitting EOB. PT recommending SNF. Pt is Medicare and current D/C plan is SNF. No apparent immediate acute care OT needs, therefore will defer OT to SNF. If OT eval is needed please call Acute Rehab Dept. at [phone] or text page OT at [phone].   Rae LipsMiller, Suhaila Troiano M [phone] 05/21/2013, 12:23 PM

## 2013-05-21 NOTE — Progress Notes (Signed)
[facility] PROGRESS NOTE  Robert Weber ZOX:[id number] DOB: 05/22/1931 DOA: 05/18/2013 PCP: No primary provider on file.  Assessment/Plan: Principal Problem:  Fracture of femoral neck, left  -Reconsulted ortho 4/2 Dr Duda>> taken to OR on 4/3 and status post left hemiarthroplasty -appreciate Dr Audrie Liauda's assisatance -Bleeding from incision overnight >> slowing down this afternoon,appreciate the assistance -continue pain mangement Active Problems:  CKD (chronic kidney disease) stage 5, GFR less than 15 ml/min -Appreciate renal assistance, creatinine trending up today -Follow and recheck Postop anemia-acute blood loss -Transfuse 2 units packed red blood cells -Follow recheck    Code Status: full Family Communication: none at bedside  Disposition Plan: likely will need SNF   Consultants:  ORTHO- Dr Lajoyce Cornersuda  renal  Procedures:  none  Antibiotics:  none   HPI/Subjective: oozing/bleeding from left hip incision/wound area overnight. He denies chest pain and shortness of breath  Objective: Filed Vitals:   05/21/13 1835  BP: 110/66  Pulse: 76  Temp: 99.1 F (37.3 C)  Resp: 16    Intake/Output Summary (Last 24 hours) at 05/21/13 1846 Last data filed at 05/21/13 1839  Gross per 24 hour  Intake    995 ml  Output    725 ml  Net    270 ml   Filed Weights   05/18/13 2138 05/19/13 2025 05/20/13 1106  Weight: 80.015 kg (176 lb 6.4 oz) 79.1 kg (174 lb 6.1 oz) 83.5 kg (184 lb 1.4 oz)    Exam:  General: alert & oriented x 3 In NAD Cardiovascular: RRR, nl S1 s2 Respiratory: CTAB Abdomen: soft +BS NT/ND, no masses palpable Extremities: No cyanosis and no edema     Data Reviewed: Basic Metabolic Panel:  Recent Labs Lab 05/19/13 0716 05/20/13 0550 05/21/13 0430  NA 146 143 145  K 4.7 4.2 4.2  CL 108 104 104  CO2 16* 21 22  GLUCOSE 110* 101* 169*  BUN 71* 69* 75*  CREATININE 6.10* 5.88* 6.61*  CALCIUM 9.3 9.2 8.1*  PHOS  --  6.0* 6.6*   Liver Function  Tests:  Recent Labs Lab 05/20/13 0550 05/21/13 0430  ALBUMIN 3.0* 2.4*   No results found for this basename: LIPASE, AMYLASE,  in the last 168 hours No results found for this basename: AMMONIA,  in the last 168 hours CBC:  Recent Labs Lab 05/19/13 0716 05/20/13 0550 05/21/13 0430  WBC 7.2 7.6 8.7  HGB 10.4* 9.8* 6.8*  HCT 30.7* 29.4* 20.4*  MCV 97.2 98.7 97.1  PLT 197 176 179   Cardiac Enzymes: No results found for this basename: CKTOTAL, CKMB, CKMBINDEX, TROPONINI,  in the last 168 hours BNP (last 3 results) No results found for this basename: PROBNP,  in the last 8760 hours CBG: No results found for this basename: GLUCAP,  in the last 168 hours  Recent Results (from the past 240 hour(s))  SURGICAL PCR SCREEN     Status: None   Collection Time    05/20/13  6:00 AM      Result Value Ref Range Status   MRSA, PCR NEGATIVE  NEGATIVE Final   Staphylococcus aureus NEGATIVE  NEGATIVE Final   Comment:            The Xpert SA Assay (FDA     approved for NASAL specimens     in patients over 78 years of age),     is one component of     a comprehensive surveillance     program.  Test performance has  been validated by [facility] for patients greater     than or equal to 50 year old.     It is not intended     to diagnose infection nor to     guide or monitor treatment.     Studies: Dg Pelvis Portable  05/20/2013   CLINICAL DATA:  Postop left hip  EXAM: PORTABLE PELVIS 1-2 VIEWS  COMPARISON:  DG HIP COMPLETE 2+V BILAT dated 05/18/2013  FINDINGS: Two spot AP projection radiographs of the pelvis are provided for review.  Images demonstrate the sequela of left bipolar hip replacement. Alignment appears near anatomic. No fracture or dislocation given anterior projection. There is expected subcutaneous emphysema about the operative site. Skin staples overlie the upper outer aspect of the left thigh. No radiopaque foreign body.  IMPRESSION: Post left bipolar hip replacement  without evidence of complication.   Electronically Signed   By: Simonne Come M.D.   On: 05/20/2013 10:07    Scheduled Meds: . amLODipine  10 mg Oral Daily  . aspirin EC  325 mg Oral Q breakfast  . calcitRIOL  0.25 mcg Oral Daily  . levothyroxine  100 mcg Oral QAC breakfast  . simvastatin  20 mg Oral q1800   Continuous Infusions: . sodium chloride    .  sodium bicarbonate 150 mEq in sterile water 1000 mL infusion 50 mL/hr at 05/21/13 1645    Principal Problem:   Fracture of femoral neck, left Active Problems:   CKD (chronic kidney disease) stage 5, GFR less than 15 ml/min   Hip fracture    Time spent: 35    Talasia Saulter C  [facility] Pager [phone]. If 7PM-7AM, please contact night-coverage at [email or web address], password [facility] 05/21/2013, 6:46 PM  LOS: 3 days

## 2013-05-21 NOTE — Evaluation (Signed)
Physical Therapy Evaluation Patient Details Name: Robert Weber MRN: [id number] DOB: 04/17/1931 Today's Date: 05/21/2013   History of Present Illness  Pt admit with fracture of femoral neck fracture of left LE.  Posterior hip precautions.   Clinical Impression  Pt admitted with above. Pt currently with functional limitations due to the deficits listed below (see PT Problem List).  Pt will benefit from skilled PT to increase their independence and safety with mobility to allow discharge to the venue listed below.     Follow Up Recommendations SNF;Supervision/Assistance - 24 hour    Equipment Recommendations  Other (comment) (TBA)    Recommendations for Other Services       Precautions / Restrictions Precautions Precautions: Fall;Posterior Hip Precaution Booklet Issued: Yes (comment) Precaution Comments: Educated to posterior hip precautions and sign hung in room.   Restrictions Weight Bearing Restrictions: Yes LLE Weight Bearing: Weight bearing as tolerated      Mobility  Bed Mobility Overal bed mobility: Needs Assistance;+2 for physical assistance Bed Mobility: Supine to Sit     Supine to sit: Mod assist;+2 for physical assistance     General bed mobility comments: Pt needed assist to elevate trunk and bring LEs off bed.  Pt with lateral lean to his left.    Transfers                    Ambulation/Gait                Stairs            Wheelchair Mobility    Modified Rankin (Stroke Patients Only)       Balance Overall balance assessment: Needs assistance;History of Falls Sitting-balance support: Bilateral upper extremity supported;Feet supported Sitting balance-Leahy Scale: Zero Sitting balance - Comments: Pt able to sit EOB for a few min with total to max assist as he was leaning posteriorly and toleft and could not obtain upright and midline even with tactile and verbal cues.  Pt then c/o dizziness. Hgb low therefore laid pt back down.    Postural control: Posterior lean;Left lateral lean                                   Pertinent Vitals/Pain VSS, pain in left LE not rated by pt.    Home Living Family/patient expects to be discharged to:: Skilled nursing facility Living Arrangements: Alone Available Help at Discharge: Available PRN/intermittently;Family Type of Home: Apartment Home Access: Stairs to enter Entrance Stairs-Rails: None Entrance Stairs-Number of Steps: 1 Home Layout: One level Home Equipment: Scientist, forensicWalker - standard;Walker - 4 wheels;Cane - single point;Shower seat;Grab bars - tub/shower      Prior Function Level of Independence: Independent               Hand Dominance   Dominant Hand: Right    Extremity/Trunk Assessment                   LLE Deficits / Details: grossly 2+/5     Communication   Communication: No difficulties  Cognition Arousal/Alertness: Awake/alert Behavior During Therapy: WFL for tasks assessed/performed Overall Cognitive Status: No family/caregiver present to determine baseline cognitive functioning                      General Comments      Exercises        Assessment/Plan    PT Assessment  Patient needs continued PT services  PT Diagnosis Generalized weakness;Acute pain   PT Problem List Decreased strength;Decreased activity tolerance;Decreased balance;Decreased mobility;Decreased knowledge of use of DME;Decreased safety awareness;Decreased knowledge of precautions  PT Treatment Interventions DME instruction;Gait training;Functional mobility training;Therapeutic activities;Therapeutic exercise;Balance training;Patient/family education   PT Goals (Current goals can be found in the Care Plan section) Acute Rehab PT Goals Patient Stated Goal: to go get therapy and then home PT Goal Formulation: With patient Time For Goal Achievement: 05/28/13 Potential to Achieve Goals: Good    Frequency Min 5X/week   Barriers to discharge  Decreased caregiver support      Co-evaluation               End of Session Equipment Utilized During Treatment: Gait belt;Oxygen Activity Tolerance: Patient limited by fatigue (limited by dizziness/ probably secondary to low Hgb) Patient left: with call bell/phone within reach;in bed;with bed alarm set;with nursing/sitter in room Nurse Communication: Mobility status;Need for lift equipment         Time: 1150-1215 PT Time Calculation (min): 25 min   Charges:   PT Evaluation $Initial PT Evaluation Tier I: 1 Procedure PT Treatments $Therapeutic Activity: 8-22 mins   PT G Codes:          INGOLD,Ovetta Bazzano May 29, 2013, 3:31 PM [facility] Acute Rehabilitation [phone] (pager)

## 2013-05-21 NOTE — Progress Notes (Signed)
Pt temp 99.9. Spoke with Dr. Suanne MarkerViyuoh. Ok to proceed with blood transfusion. Hortencia ConradiWendi Marvel Mcphillips, RN

## 2013-05-21 NOTE — Progress Notes (Signed)
Patient still has had no urine out put since I&O cath Saturday am.  Bladder scan shows 193 mL. Robert Weber. Callahan, NP notified.  Will place foley catheter per MD order.  Will continue to monitor.

## 2013-05-21 NOTE — Progress Notes (Addendum)
   Subjective:  Patient reports pain as moderate.    Objective:   VITALS:   Filed Vitals:   05/20/13 1702 05/20/13 2057 05/21/13 0358 05/21/13 0852  BP: 114/58 103/56 101/63 94/57  Pulse: 102 102 105 104  Temp: 99.5 F (37.5 C) 99 F (37.2 C) 99.1 F (37.3 C) 99.6 F (37.6 C)  TempSrc: Oral   Oral  Resp: 16 17 17 16   Height:      Weight:      SpO2: 97% 95% 92% 94%    Neurologically intact Neurovascular intact Sensation intact distally Intact pulses distally Dorsiflexion/Plantar flexion intact Incision: dressing C/D/I and scant drainage No cellulitis present Compartment soft   Lab Results  Component Value Date   WBC 8.7 05/21/2013   HGB 6.8* 05/21/2013   HCT 20.4* 05/21/2013   MCV 97.1 05/21/2013   PLT 179 05/21/2013     Assessment/Plan:  1 Day Post-Op   - Expected postop acute blood loss anemia - will monitor for symptoms - agree with transfusion 2 units - Up with PT/OT - DVT ppx - SCDs, ambulation, asa - WBAT left lower extremity, posterior hip precautions - Pain control - well controlled - drainage is minimal from surgery, compression dressing applied  Problem List Items Addressed This Visit     Musculoskeletal and Integument   *Fracture of femoral neck, left - Primary   Relevant Orders      Weight bearing as tolerated     Genitourinary   CKD (chronic kidney disease) stage 5, GFR less than 15 ml/min (Chronic)       Robert Weber, Robert Weber 05/21/2013, 10:11 AM [phone]

## 2013-05-21 NOTE — Progress Notes (Signed)
Pt found to have serosanguinous moderate to copious amount of draninage from left hip surgical wound. Underpad changed. Pt noted to be pale, alert and oriented x 4, denies dizziness, lightheadedness. Reports pain in Left hip 10/10 with movement. BP 94/57. Temp 99.6. HR 104. Resp 16. O2 sat 94% RA, drops to 88% with movement for turning. Noted hgb down to 6.8 form 9.8. Orders previously received for 2 units PRBCs. Pt in agreement with blood transfusion. Consent signed.  Dr. Roda ShuttersXu notified of continued bleeding and decrease in bp. Continue to monitor. Hortencia ConradiWendi Johni Narine, RN

## 2013-05-22 LAB — CBC
HCT: 20.7 % — ABNORMAL LOW (ref 39.0–52.0)
Hemoglobin: 7.2 g/dL — ABNORMAL LOW (ref 13.0–17.0)
MCH: 32.1 pg (ref 26.0–34.0)
MCHC: 34.8 g/dL (ref 30.0–36.0)
MCV: 92.4 fL (ref 78.0–100.0)
PLATELETS: 124 10*3/uL — AB (ref 150–400)
RBC: 2.24 MIL/uL — AB (ref 4.22–5.81)
RDW: 15.8 % — ABNORMAL HIGH (ref 11.5–15.5)
WBC: 7.4 10*3/uL (ref 4.0–10.5)

## 2013-05-22 LAB — RENAL FUNCTION PANEL
Albumin: 2 g/dL — ABNORMAL LOW (ref 3.5–5.2)
BUN: 90 mg/dL — ABNORMAL HIGH (ref 6–23)
CHLORIDE: 98 meq/L (ref 96–112)
CO2: 25 meq/L (ref 19–32)
Calcium: 8 mg/dL — ABNORMAL LOW (ref 8.4–10.5)
Creatinine, Ser: 8.11 mg/dL — ABNORMAL HIGH (ref 0.50–1.35)
GFR, EST AFRICAN AMERICAN: 6 mL/min — AB (ref >90)
GFR, EST NON AFRICAN AMERICAN: 6 mL/min — AB (ref >90)
Glucose, Bld: 115 mg/dL — ABNORMAL HIGH (ref 70–99)
POTASSIUM: 4.2 meq/L (ref 3.7–5.3)
Phosphorus: 7.7 mg/dL — ABNORMAL HIGH (ref 2.3–4.6)
SODIUM: 142 meq/L (ref 137–147)

## 2013-05-22 MED ORDER — DARBEPOETIN ALFA-POLYSORBATE 150 MCG/0.3ML IJ SOLN
150.0000 ug | INTRAMUSCULAR | Status: DC
  Administered 2013-05-22: 150 ug via SUBCUTANEOUS
  Filled 2013-05-22: qty 0.3

## 2013-05-22 MED ORDER — SODIUM CHLORIDE 0.9 % IV SOLN
125.0000 mg | Freq: Every day | INTRAVENOUS | Status: AC
  Administered 2013-05-22 – 2013-05-26 (×5): 125 mg via INTRAVENOUS
  Filled 2013-05-22 (×9): qty 10

## 2013-05-22 NOTE — Clinical Social Work Note (Signed)
CSW continues to follow this case regarding d/c plan. CSW met with patient and informed him of bed offer with [facility]. Patient reported [facility] first preference and [facility] is second preference. Patient stated he spent 3 months at [facility] in 2004 and it was a decent experience. Patient reported he will wait for response from preferred facilities. CSW verbalized understanding of the above. CSW to provide follow-up tomorrow.   Anselmo, [place] Weekend Clinical Social Worker [phone]

## 2013-05-22 NOTE — Progress Notes (Signed)
[facility] ROUNDING NOTE  SUMMARY IMPRESSION 81yo WM with PMH sig for HTN, hypothyroidism, and CKD stage 4 [Scr running 5.7 (4/14) to 5.9 in (10/14)] who was admitted after a fall at home complicated by L hip fracture. He was admitted for orthopedic surgical evaluation/ repair. We were asked to see for CKD related issues.    Assessment/Plan:  1. CKD stage 4/5- creatinine up 5.8->6.6->8.1 today.  BP soft, likely due to drop in Hb to 6.8 yesterday.  Making less urine.  Will continue to follow closely. No dialysis indications at this time. If creatinine up again tomorrow [facility] (pt had advanced CKD4/early CKD5  at baseline). (Prior left AVF failed) 2. Left hip fracture- s/p L ORIF  by Dr. Lajoyce Cornersuda (4/3) 3. Vascular access- his L AVF was clotted in October. CKD has been progressive over the last year. See access plan above. 4. Anemia of chronic disease as well as ABLA from trauma. Follow H/H. Will check iron stores and may need transfusion and/or initiation of EPO.Hgb was 11.7 on 04/29/13.  Has dropped to 6.8 post op->only 7.2 post transfusion. May need another unit.  Serum iron <10.  Replete iron IV.  Start darbepoetin..  5. Metabolic Acidosis- Resolved. Stop isotonic bicarb. 6. 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 Weak. Preop creatinine 5.88 (BL) up to 6.6 K is fine so far  Objective Vital signs in last 24 hours: Filed Vitals:   05/22/13 0432 05/22/13 0800 05/22/13 0932 05/22/13 1200  BP: 108/58  99/50   Pulse: 80  81   Temp: 98.4 F (36.9 C)  97.7 F (36.5 C)   TempSrc:   Oral   Resp: 18 19 16 17   Height:      Weight:      SpO2: 93% 98% 95% 95%   Weight change:   Intake/Output Summary (Last 24 hours) at 05/22/13 1256 Last data filed at 05/22/13 1009  Gross per 24 hour  Intake   1355 ml  Output    150 ml  Net   1205 ml   Physical Exam:   Blood pressure 120/66, pulse 94, temperature 98.2 F (36.8 C), temperature source Oral, resp. rate 14, height 6' (1.829 m), weight 79.1 kg (174 lb 6.1 oz), SpO2 94.00%. General appearance: alert, NAD  Head: Normocephalic, without obvious abnormality, atraumatic  Neck:  no JVD Resp: clear without crackles or wheezes Cardio: regular rate and rhythm, S1, S2 normal, no murmur, click, rub or gallop  GI: soft, non-tender; bowel sounds normal; no masses, no organomegaly  Extremities dry dressings left hip; left knee in immobilizer and left foot ace wrapped; no RLE edema  Labs: Basic Metabolic Panel:  Recent Labs Lab 05/19/13 0716 05/20/13 0550 05/21/13 0430 05/22/13 0626  NA 146 143 145 142  K 4.7 4.2 4.2 4.2  CL 108 104 104 98  CO2 16* 21 22 25   GLUCOSE 110* 101* 169* 115*  BUN 71* 69* 75* 90*  CREATININE 6.10* 5.88* 6.61* 8.11*  CALCIUM 9.3 9.2 8.1* 8.0*  PHOS  --  6.0* 6.6* 7.7*    Recent Labs Lab 05/20/13 0550 05/21/13 0430 05/22/13 0626  ALBUMIN 3.0* 2.4* 2.0*    Recent Labs Lab 05/19/13 0716 05/20/13 0550 05/21/13 0430 05/22/13 0626  WBC 7.2 7.6 8.7 7.4  HGB 10.4* 9.8* 6.8* 7.2*  HCT 30.7* 29.4* 20.4* 20.7*  MCV 97.2  98.7 97.1 92.4  PLT 197 176 179 124*  Results for MARCANTHONY, SLEIGHT (MRN [id number]) as of 05/22/2013 12:59  Ref. Range 05/21/2013 04:30  Iron Latest Range: 42-135 ug/dL <74 (L)  UIBC Latest Range: 125-400 ug/dL 259  TIBC Latest Range: 215-435 ug/dL Not calculated due to Iron <10.  Saturation Ratios Latest Range: 20-55 % Not calculated due to Iron <10.  Ferritin Latest Range: 22-322 ng/mL 149   Studies/Results: No results found. Medications: . sodium chloride    .  sodium bicarbonate 150 mEq in sterile water 1000 mL infusion 50 mL/hr at 05/21/13 1900   . amLODipine  10 mg Oral Daily  . aspirin EC  325 mg Oral Q breakfast  . calcitRIOL  0.25 mcg Oral Daily  . levothyroxine  100 mcg Oral QAC breakfast  . simvastatin  20 mg Oral q1800    Camille Bal, MD [facility] [phone] pager 05/22/2013, 12:56 PM

## 2013-05-22 NOTE — Progress Notes (Signed)
[facility] PROGRESS NOTE  Marsean Elkhatib WUJ:[id number] DOB: 1931/10/09 DOA: 05/18/2013 PCP: No primary provider on file.  Assessment/Plan: Principal Problem:  Fracture of femoral neck, left  -Reconsulted ortho 4/2 Dr Duda>> taken to OR on 4/3 and status post left hemiarthroplasty -appreciate Dr Audrie Lia assisatance -Bleeding from incision overnight 4/3>> now resolved -continue pain mangement Active Problems:  CKD (chronic kidney disease) stage 5, GFR less than 15 ml/min -Appreciate renal assistance, creatinine much worse today, 8.11 -Per renal no dialysis indications at this time, to followup in a.m. and go a head with [facility] if Cr continuing to trend up. -Follow recheck in a.m. Postop anemia-acute blood loss -Status post Transfusion 2 units packed red blood cells>> hemoglobin today 7.2, follow recheck     Code Status: full Family Communication: none at bedside  Disposition Plan: likely will need SNF   Consultants:  ORTHO- Dr Lajoyce Corners  renal  Procedures:  none  Antibiotics:  none   HPI/Subjective: No further bleeding from incision, feeling better today. Denies any complaints.  Objective: Filed Vitals:   05/22/13 1743  BP: 100/48  Pulse: 80  Temp: 98.4 F (36.9 C)  Resp: 18    Intake/Output Summary (Last 24 hours) at 05/22/13 1756 Last data filed at 05/22/13 1700  Gross per 24 hour  Intake   1200 ml  Output    600 ml  Net    600 ml   Filed Weights   05/18/13 2138 05/19/13 2025 05/20/13 1106  Weight: 80.015 kg (176 lb 6.4 oz) 79.1 kg (174 lb 6.1 oz) 83.5 kg (184 lb 1.4 oz)    Exam:  General: alert & oriented x 3 In NAD Cardiovascular: RRR, nl S1 s2 Respiratory: CTAB Abdomen: soft +BS NT/ND, no masses palpable Extremities: No cyanosis and no edema     Data Reviewed: Basic Metabolic Panel:  Recent Labs Lab 05/19/13 0716 05/20/13 0550 05/21/13 0430 05/22/13 0626  NA 146 143 145 142  K 4.7 4.2 4.2 4.2  CL 108 104 104 98  CO2 16* 21 22 25    GLUCOSE 110* 101* 169* 115*  BUN 71* 69* 75* 90*  CREATININE 6.10* 5.88* 6.61* 8.11*  CALCIUM 9.3 9.2 8.1* 8.0*  PHOS  --  6.0* 6.6* 7.7*   Liver Function Tests:  Recent Labs Lab 05/20/13 0550 05/21/13 0430 05/22/13 0626  ALBUMIN 3.0* 2.4* 2.0*   No results found for this basename: LIPASE, AMYLASE,  in the last 168 hours No results found for this basename: AMMONIA,  in the last 168 hours CBC:  Recent Labs Lab 05/19/13 0716 05/20/13 0550 05/21/13 0430 05/22/13 0626  WBC 7.2 7.6 8.7 7.4  HGB 10.4* 9.8* 6.8* 7.2*  HCT 30.7* 29.4* 20.4* 20.7*  MCV 97.2 98.7 97.1 92.4  PLT 197 176 179 124*   Cardiac Enzymes: No results found for this basename: CKTOTAL, CKMB, CKMBINDEX, TROPONINI,  in the last 168 hours BNP (last 3 results) No results found for this basename: PROBNP,  in the last 8760 hours CBG: No results found for this basename: GLUCAP,  in the last 168 hours  Recent Results (from the past 240 hour(s))  SURGICAL PCR SCREEN     Status: None   Collection Time    05/20/13  6:00 AM      Result Value Ref Range Status   MRSA, PCR NEGATIVE  NEGATIVE Final   Staphylococcus aureus NEGATIVE  NEGATIVE Final   Comment:            The Xpert SA Assay (  FDA     approved for NASAL specimens     in patients over 78 years of age),     is one component of     a comprehensive surveillance     program.  Test performance has     been validated by [facility] for patients greater     than or equal to 78 year old.     It is not intended     to diagnose infection nor to     guide or monitor treatment.     Studies: No results found.  Scheduled Meds: . aspirin EC  325 mg Oral Q breakfast  . calcitRIOL  0.25 mcg Oral Daily  . darbepoetin (ARANESP) injection - NON-DIALYSIS  150 mcg Subcutaneous Q Sun-1800  . ferric gluconate (FERRLECIT/NULECIT) IV  125 mg Intravenous Daily  . levothyroxine  100 mcg Oral QAC breakfast  . simvastatin  20 mg Oral q1800   Continuous  Infusions: . sodium chloride      Principal Problem:   Fracture of femoral neck, left Active Problems:   CKD (chronic kidney disease) stage 5, GFR less than 15 ml/min   Hip fracture    Time spent: 35    Gazella Anglin C  [facility] Pager [phone]. If 7PM-7AM, please contact night-coverage at [email or web address], password [email or web address] 05/22/2013, 5:56 PM  LOS: 4 days

## 2013-05-22 NOTE — Progress Notes (Signed)
Subjective:  Patient reports he feels much better today after getting blood.    Objective:   VITALS:   Filed Vitals:   05/21/13 1835 05/21/13 1932 05/21/13 2016 05/22/13 0432  BP: 110/66 98/59 107/49 108/58  Pulse: 76 92 90 80  Temp: 99.1 F (37.3 C) 98.3 F (36.8 C) 98.9 F (37.2 C) 98.4 F (36.9 C)  TempSrc: Oral Oral Oral   Resp: 16 16 16 18   Height:      Weight:      SpO2: 94% 99% 95% 93%    Neurologically intact Neurovascular intact Sensation intact distally Intact pulses distally Dorsiflexion/Plantar flexion intact Incision: dressing C/D/I and scant drainage No cellulitis present Compartment soft   Lab Results  Component Value Date   WBC 7.4 05/22/2013   HGB 7.2* 05/22/2013   HCT 20.7* 05/22/2013   MCV 92.4 05/22/2013   PLT 124* 05/22/2013     Assessment/Plan:  2 Days Post-Op   - Expected postop acute blood loss anemia - will monitor for symptoms - Up with PT/OT - DVT ppx - SCDs, ambulation, asa - WBAT left lower extremity, posterior hip precautions - Pain control - well controlled - dressing is c/d/i  Problem List Items Addressed This Visit     Musculoskeletal and Integument   *Fracture of femoral neck, left - Primary   Relevant Orders      Weight bearing as tolerated     Genitourinary   CKD (chronic kidney disease) stage 5, GFR less than 15 ml/min (Chronic)       Cheral AlmasXu, Analisse Randle Michael 05/22/2013, 8:45 AM [phone]

## 2013-05-23 ENCOUNTER — Encounter (HOSPITAL_COMMUNITY): Payer: Self-pay | Admitting: Orthopedic Surgery

## 2013-05-23 LAB — RENAL FUNCTION PANEL
Albumin: 1.9 g/dL — ABNORMAL LOW (ref 3.5–5.2)
BUN: 100 mg/dL — ABNORMAL HIGH (ref 6–23)
CALCIUM: 8.4 mg/dL (ref 8.4–10.5)
CO2: 26 mEq/L (ref 19–32)
CREATININE: 8.04 mg/dL — AB (ref 0.50–1.35)
Chloride: 99 mEq/L (ref 96–112)
GFR calc Af Amer: 6 mL/min — ABNORMAL LOW (ref >90)
GFR, EST NON AFRICAN AMERICAN: 6 mL/min — AB (ref >90)
GLUCOSE: 107 mg/dL — AB (ref 70–99)
POTASSIUM: 4.3 meq/L (ref 3.7–5.3)
Phosphorus: 6.9 mg/dL — ABNORMAL HIGH (ref 2.3–4.6)
SODIUM: 144 meq/L (ref 137–147)

## 2013-05-23 LAB — PREPARE RBC (CROSSMATCH)

## 2013-05-23 LAB — CBC
HCT: 19.6 % — ABNORMAL LOW (ref 39.0–52.0)
HEMOGLOBIN: 6.6 g/dL — AB (ref 13.0–17.0)
MCH: 31.9 pg (ref 26.0–34.0)
MCHC: 33.7 g/dL (ref 30.0–36.0)
MCV: 94.7 fL (ref 78.0–100.0)
Platelets: 145 10*3/uL — ABNORMAL LOW (ref 150–400)
RBC: 2.07 MIL/uL — AB (ref 4.22–5.81)
RDW: 15.1 % (ref 11.5–15.5)
WBC: 6.8 10*3/uL (ref 4.0–10.5)

## 2013-05-23 MED ORDER — CEFUROXIME SODIUM 1.5 G IJ SOLR
1.5000 g | INTRAMUSCULAR | Status: AC
  Administered 2013-05-24: 1.5 g via INTRAVENOUS
  Filled 2013-05-23 (×2): qty 1.5

## 2013-05-23 MED ORDER — BISACODYL 5 MG PO TBEC
10.0000 mg | DELAYED_RELEASE_TABLET | Freq: Every day | ORAL | Status: DC | PRN
Start: 2013-05-23 — End: 2013-05-27
  Administered 2013-05-23: 10 mg via ORAL
  Filled 2013-05-23: qty 2

## 2013-05-23 MED ORDER — RENA-VITE PO TABS
1.0000 | ORAL_TABLET | Freq: Every day | ORAL | Status: DC
  Administered 2013-05-23 – 2013-05-26 (×4): 1 via ORAL
  Filled 2013-05-23 (×5): qty 1

## 2013-05-23 NOTE — Progress Notes (Signed)
    Subjective  -  No complaints today No SOB   Physical Exam:  Pulm: CTA Ext palp radial pulse on left bilat edema       Assessment/Plan:  ESRD  I reviewed his vein map.  He does no have adequate cephalic vein bilaterally.  I would plan on L BVT, possible staged.  His BUN increase to 100  From 90 todau.  Cr. Unchanged at 8.  If needs TDC, I can do this tomorrow in conjunction with AVF.  Please contact me if this is required  Jahan Friedlander IV, V. WELLS 05/23/2013 7:34 AM --  Filed Vitals:   05/23/13 0424  BP: 111/63  Pulse: 78  Temp: 99.6 F (37.6 C)  Resp: 20    Intake/Output Summary (Last 24 hours) at 05/23/13 0734 Last data filed at 05/23/13 0700  Gross per 24 hour  Intake 594.33 ml  Output   1650 ml  Net -1055.67 ml     Laboratory CBC    Component Value Date/Time   WBC 6.8 05/23/2013 0455   HGB 6.6* 05/23/2013 0455   HCT 19.6* 05/23/2013 0455   PLT 145* 05/23/2013 0455    BMET    Component Value Date/Time   NA 144 05/23/2013 0455   K 4.3 05/23/2013 0455   CL 99 05/23/2013 0455   CO2 26 05/23/2013 0455   GLUCOSE 107* 05/23/2013 0455   BUN 100* 05/23/2013 0455   CREATININE 8.04* 05/23/2013 0455   CALCIUM 8.4 05/23/2013 0455   GFRNONAA 6* 05/23/2013 0455   GFRAA 6* 05/23/2013 0455    COAG No results found for this basename: INR, PROTIME   No results found for this basename: PTT    Antibiotics Anti-infectives   Start     Dose/Rate Route Frequency Ordered Stop   05/20/13 1115  ceFAZolin (ANCEF) IVPB 2 g/50 mL premix     2 g 100 mL/hr over 30 Minutes Intravenous Every 6 hours 05/20/13 1105 05/20/13 1840   05/20/13 0600  ceFAZolin (ANCEF) IVPB 2 g/50 mL premix     2 g 100 mL/hr over 30 Minutes Intravenous On call to O.R. 05/20/13 0515 05/20/13 0803       V. Charlena CrossWells Sapna Padron IV, M.D. [facility] [place] Office: [phone] Pager:  [phone]

## 2013-05-23 NOTE — Progress Notes (Signed)
CRITICAL VALUE ALERT  Critical value received: Hemoglobin 6.6  Date of notification: 05/23/2013    Time of notification: 0600  Critical value read back:yes  Nurse who received alert: Janice NorrieAnessa Tyqwan Pink, RN [phone]  MD notified (1st page): NP T. Claiborne Billingsallahan  Time of first page:  0609  MD notified (2nd page):  Time of second page:  Responding MD: Benedetto Coons. Callahan  Time MD responded: [phone]

## 2013-05-23 NOTE — Progress Notes (Signed)
Subjective: Interval History: has no complaint, acknowledges need for HD.  Objective: Vital signs in last 24 hours: Temp:  [97.4 F (36.3 C)-100.5 F (38.1 C)] 98.6 F (37 C) (04/06 1216) Pulse Rate:  [71-84] 71 (04/06 1216) Resp:  [16-20] 18 (04/06 1216) BP: (95-111)/(45-63) 101/58 mmHg (04/06 1216) SpO2:  [86 %-95 %] 95 % (04/06 1216) Weight change:   Intake/Output from previous day: 04/05 0701 - 04/06 0700 In: 594.3 [P.O.:540; I.V.:54.3] Out: 1650 [Urine:1650] Intake/Output this shift: Total I/O In: 585 [I.V.:250; Blood:335] Out: 275 [Urine:275]  General appearance: cooperative, mildly obese and pale Resp: diminished breath sounds bilaterally Cardio: S1, S2 normal and systolic murmur: holosystolic 2/6, blowing at apex GI: soft,liver down 4 cm, pos bs Extremities: edema tr  Lab Results:  Recent Labs  05/22/13 0626 05/23/13 0455  WBC 7.4 6.8  HGB 7.2* 6.6*  HCT 20.7* 19.6*  PLT 124* 145*   BMET:  Recent Labs  05/22/13 0626 05/23/13 0455  NA 142 144  K 4.2 4.3  CL 98 99  CO2 25 26  GLUCOSE 115* 107*  BUN 90* 100*  CREATININE 8.11* 8.04*  CALCIUM 8.0* 8.4   No results found for this basename: PTH,  in the last 72 hours Iron Studies:  Recent Labs  05/21/13 0430  IRON <10*  TIBC Not calculated due to Iron <10.  FERRITIN 149    Studies/Results: No results found.  I have reviewed the patient's current medications.  Assessment/Plan: 1 CKD with progression to ESRD.  Needs Hd, will get cath and perm access in am and do HD. Acid/base/k ok.  Mild vol xs 2 anemia falling ??? Hip on epo/fe 3 HPTH meds 4 hypothyroid P PC, access, epo , Fe, vit D   LOS: 5 days   Lajeana Strough L 05/23/2013,1:37 PM

## 2013-05-23 NOTE — Progress Notes (Signed)
05/23/2013 1:49 PM  Upon entering room to obtain 1316 vital signs for blood administration, pt noted to be lying in a puddle of blood.  Apparently the IV tubing came disconnected from the IV port.  IV tubing was intact at 1216 vitals, so patient came disconnected sometime in the past hour.  Unsure of how much blood patient received in the past hour.  Vitals obtained, pt stable.  Dr. Suanne MarkerViyuoh aware, orders received to T&C and transfuse one more unit of blood.  Blood bank notified of incident, no further monitoring needed at this time, blood returned to them per request due to break in sterility.  Will continue to monitor patient. Theadora RamaKIRKMAN, Kiki Bivens Brooke

## 2013-05-23 NOTE — Progress Notes (Signed)
Physical Therapy Treatment Patient Details Name: Robert Weber MRN: [id number] DOB: Feb 18, 1931 Today's Date: 05/23/2013    History of Present Illness Pt admit with fracture of femoral neck fracture of left LE.  Posterior hip precautions.     PT Comments    Patient able to participate in sit to stand trials at edge of bed.  Still limited tolerance and unable to step to chair, but hopeful for improved tolerance each session.  Will need SNF rehab at d/c.  Follow Up Recommendations  SNF;Supervision/Assistance - 24 hour     Equipment Recommendations  Other (comment) (TBA)    Recommendations for Other Services       Precautions / Restrictions Precautions Precautions: Fall;Posterior Hip Precaution Comments: Educated to posterior hip precautions  Restrictions LLE Weight Bearing: Weight bearing as tolerated    Mobility  Bed Mobility Overal bed mobility: Needs Assistance;+2 for physical assistance Bed Mobility: Sit to Supine;Supine to Sit     Supine to sit: +2 for physical assistance;Max assist Sit to supine: +2 for physical assistance;Max assist   General bed mobility comments: allowed use of overhead trapeze for scooting over and up in bed; assist for legs off bed and to lift trunk due to leaning posterior; to supine needed assist for legs and trunk, able to scoot up with over head trapeze and increased time, bed in trendelenberg  Transfers Overall transfer level: Needs assistance Equipment used: Rolling walker (2 wheeled) Transfers: Stand Pivot Transfers   Stand pivot transfers: +2 physical assistance;Max assist;From elevated surface       General transfer comment: stood from elevated bed with lifting assist, cues for right leg and arm extension and to bring hips forward; stood about 30 seconds max with cues  Ambulation/Gait                 Stairs            Wheelchair Mobility    Modified Rankin (Stroke Patients Only)       Balance Overall balance  assessment: Needs assistance Sitting-balance support: Bilateral upper extremity supported;Feet supported Sitting balance-Leahy Scale: Poor Sitting balance - Comments: sat edge of bed with mod support overall x about 6 minutes   Standing balance support: Bilateral upper extremity supported Standing balance-Leahy Scale: Zero Standing balance comment: can only tolerate about 30 seconds max over two trials to stand                    Cognition Arousal/Alertness: Awake/alert Behavior During Therapy: WFL for tasks assessed/performed Overall Cognitive Status: Within Functional Limits for tasks assessed                      Exercises Total Joint Exercises Ankle Circles/Pumps: AROM;Both;15 reps;Supine Short Arc Quad: AAROM;Right;5 reps Heel Slides: AAROM;Right;5 reps Hip ABduction/ADduction: AAROM;Right;5 reps;Supine    General Comments        Pertinent Vitals/Pain Premedicated; pain about 5-6/10 with mobility    Home Living                      Prior Function            PT Goals (current goals can now be found in the care plan section) Progress towards PT goals: Progressing toward goals    Frequency  Min 3X/week    PT Plan Current plan remains appropriate;Frequency needs to be updated    Co-evaluation             End  of Session Equipment Utilized During Treatment: Gait belt Activity Tolerance: Patient limited by fatigue Patient left: in bed;with call bell/phone within reach;with family/visitor present     Time: [id number] PT Time Calculation (min): 29 min  Charges:  $Therapeutic Exercise: 8-22 mins $Therapeutic Activity: 8-22 mins                    G Codes:      Robert Weber,CYNDI 05/23/2013, 3:33 PM Sheran Lawlessyndi Bartt Gonzaga, PT [phone] 05/23/2013

## 2013-05-23 NOTE — Progress Notes (Signed)
[facility] PROGRESS NOTE  Robert Weber WUJ:[id number] DOB: [birth year] DOA: 05/18/2013 PCP: No primary provider on file.  Assessment/Plan: Principal Problem:  Fracture of femoral neck, left  -Reconsulted ortho 4/2 Dr Duda>> taken to OR on 4/3 and status post left hemiarthroplasty -appreciate Dr Audrie Lia assisatance -Bleeding from incision overnight 4/3>> now resolved -continue pain mangement Active Problems:  CKD (chronic kidney disease) stage 5, GFR less than 15 ml/min -Appreciate renal assistance, creatinine with no significant change a point all 4 -Appreciate Dr. Deterding's assistance>> catheter to be placed and vascular consulted for permanent access>> needs dialysis -Follow recheck in a.m. Postop anemia-acute blood loss -Status post Transfusion 2 units packed red blood cells>> hemoglobin 7.2 on 4/5 -Hemoglobin 6.6 this a.m., Patient will need another unit of blood transfused     Code Status: full Family Communication: none at bedside  Disposition Plan: likely will need SNF   Consultants:  ORTHO- Dr Lajoyce Corners  renal  Procedures:  none  Antibiotics:  none   HPI/Subjective: Patient being transfused but IV appears to have disconnected from port and blood all over his pillow. Patient denies any complaints  Objective: Filed Vitals:   05/23/13 1627  BP: 108/62  Pulse: 68  Temp: 99.1 F (37.3 C)  Resp: 18    Intake/Output Summary (Last 24 hours) at 05/23/13 1711 Last data filed at 05/23/13 1612  Gross per 24 hour  Intake 1220.66 ml  Output   1475 ml  Net -254.34 ml   Filed Weights   05/18/13 2138 05/19/13 2025 05/20/13 1106  Weight: 80.015 kg (176 lb 6.4 oz) 79.1 kg (174 lb 6.1 oz) 83.5 kg (184 lb 1.4 oz)    Exam:  General: alert & oriented x 3 In NAD Cardiovascular: RRR, nl S1 s2 Respiratory: CTAB Abdomen: soft +BS NT/ND, no masses palpable Extremities: No cyanosis and no edema     Data Reviewed: Basic Metabolic Panel:  Recent Labs Lab  05/19/13 0716 05/20/13 0550 05/21/13 0430 05/22/13 0626 05/23/13 0455  NA 146 143 145 142 144  K 4.7 4.2 4.2 4.2 4.3  CL 108 104 104 98 99  CO2 16* 21 22 25 26   GLUCOSE 110* 101* 169* 115* 107*  BUN 71* 69* 75* 90* 100*  CREATININE 6.10* 5.88* 6.61* 8.11* 8.04*  CALCIUM 9.3 9.2 8.1* 8.0* 8.4  PHOS  --  6.0* 6.6* 7.7* 6.9*   Liver Function Tests:  Recent Labs Lab 05/20/13 0550 05/21/13 0430 05/22/13 0626 05/23/13 0455  ALBUMIN 3.0* 2.4* 2.0* 1.9*   No results found for this basename: LIPASE, AMYLASE,  in the last 168 hours No results found for this basename: AMMONIA,  in the last 168 hours CBC:  Recent Labs Lab 05/19/13 0716 05/20/13 0550 05/21/13 0430 05/22/13 0626 05/23/13 0455  WBC 7.2 7.6 8.7 7.4 6.8  HGB 10.4* 9.8* 6.8* 7.2* 6.6*  HCT 30.7* 29.4* 20.4* 20.7* 19.6*  MCV 97.2 98.7 97.1 92.4 94.7  PLT 197 176 179 124* 145*   Cardiac Enzymes: No results found for this basename: CKTOTAL, CKMB, CKMBINDEX, TROPONINI,  in the last 168 hours BNP (last 3 results) No results found for this basename: PROBNP,  in the last 8760 hours CBG: No results found for this basename: GLUCAP,  in the last 168 hours  Recent Results (from the past 240 hour(s))  SURGICAL PCR SCREEN     Status: None   Collection Time    05/20/13  6:00 AM      Result Value Ref Range Status  MRSA, PCR NEGATIVE  NEGATIVE Final   Staphylococcus aureus NEGATIVE  NEGATIVE Final   Comment:            The Xpert SA Assay (FDA     approved for NASAL specimens     in patients over 78 years of age),     is one component of     a comprehensive surveillance     program.  Test performance has     been validated by [facility] for patients greater     than or equal to 78 year old.     It is not intended     to diagnose infection nor to     guide or monitor treatment.     Studies: No results found.  Scheduled Meds: . aspirin EC  325 mg Oral Q breakfast  . calcitRIOL  0.25 mcg Oral Daily  .  [START ON 05/24/2013] cefUROXime (ZINACEF)  IV  1.5 g Intravenous On Call to OR  . darbepoetin (ARANESP) injection - NON-DIALYSIS  150 mcg Subcutaneous Q Sun-1800  . ferric gluconate (FERRLECIT/NULECIT) IV  125 mg Intravenous Daily  . levothyroxine  100 mcg Oral QAC breakfast  . multivitamin  1 tablet Oral QHS  . simvastatin  20 mg Oral q1800   Continuous Infusions: . sodium chloride 20 mL/hr at 05/23/13 1343    Principal Problem:   Fracture of femoral neck, left Active Problems:   CKD (chronic kidney disease) stage 5, GFR less than 15 ml/min   Hip fracture    Time spent: 35    Robert Weber C  [facility] Pager [phone]. If 7PM-7AM, please contact night-coverage at [email or web address], password [email or web address] 05/23/2013, 5:11 PM  LOS: 5 days

## 2013-05-24 ENCOUNTER — Encounter (HOSPITAL_COMMUNITY)
Admission: AD | Disposition: A | Discharge status: Skilled Nursing Facility | Payer: Self-pay | Source: Other Acute Inpatient Hospital | Attending: Internal Medicine

## 2013-05-24 ENCOUNTER — Inpatient Hospital Stay (HOSPITAL_COMMUNITY): Payer: Medicare Other | Admitting: Anesthesiology

## 2013-05-24 ENCOUNTER — Other Ambulatory Visit: Payer: Self-pay | Admitting: *Deleted

## 2013-05-24 ENCOUNTER — Inpatient Hospital Stay (HOSPITAL_COMMUNITY): Payer: Medicare Other

## 2013-05-24 ENCOUNTER — Encounter (HOSPITAL_COMMUNITY): Payer: Medicare Other | Admitting: Anesthesiology

## 2013-05-24 ENCOUNTER — Encounter (HOSPITAL_COMMUNITY): Payer: Self-pay | Admitting: Anesthesiology

## 2013-05-24 DIAGNOSIS — Z4931 Encounter for adequacy testing for hemodialysis: Secondary | ICD-10-CM

## 2013-05-24 DIAGNOSIS — N186 End stage renal disease: Secondary | ICD-10-CM

## 2013-05-24 DIAGNOSIS — N184 Chronic kidney disease, stage 4 (severe): Secondary | ICD-10-CM

## 2013-05-24 HISTORY — PX: AV FISTULA PLACEMENT: SHX1204

## 2013-05-24 HISTORY — PX: INSERTION OF DIALYSIS CATHETER: SHX1324

## 2013-05-24 LAB — COMPREHENSIVE METABOLIC PANEL
ALK PHOS: 48 U/L (ref 39–117)
ALT: <5 U/L (ref 0–53)
AST: 29 U/L (ref 0–37)
Albumin: 2 g/dL — ABNORMAL LOW (ref 3.5–5.2)
BUN: 103 mg/dL — AB (ref 6–23)
CHLORIDE: 100 meq/L (ref 96–112)
CO2: 25 mEq/L (ref 19–32)
CREATININE: 7.48 mg/dL — AB (ref 0.50–1.35)
Calcium: 8.6 mg/dL (ref 8.4–10.5)
GFR calc non Af Amer: 6 mL/min — ABNORMAL LOW (ref >90)
GFR, EST AFRICAN AMERICAN: 7 mL/min — AB (ref >90)
GLUCOSE: 106 mg/dL — AB (ref 70–99)
Potassium: 4.4 mEq/L (ref 3.7–5.3)
Sodium: 143 mEq/L (ref 137–147)
Total Bilirubin: 0.4 mg/dL (ref 0.3–1.2)
Total Protein: 5.4 g/dL — ABNORMAL LOW (ref 6.0–8.3)

## 2013-05-24 LAB — TYPE AND SCREEN
ABO/RH(D): O POS
Antibody Screen: NEGATIVE
UNIT DIVISION: 0
UNIT DIVISION: 0
Unit division: 0
Unit division: 0

## 2013-05-24 LAB — HEPATITIS B SURFACE ANTIBODY,QUALITATIVE: HEP B S AB: NEGATIVE

## 2013-05-24 LAB — CBC
HCT: 22.5 % — ABNORMAL LOW (ref 39.0–52.0)
Hemoglobin: 7.5 g/dL — ABNORMAL LOW (ref 13.0–17.0)
MCH: 31.3 pg (ref 26.0–34.0)
MCHC: 33.3 g/dL (ref 30.0–36.0)
MCV: 93.8 fL (ref 78.0–100.0)
PLATELETS: 162 10*3/uL (ref 150–400)
RBC: 2.4 MIL/uL — AB (ref 4.22–5.81)
RDW: 15.6 % — AB (ref 11.5–15.5)
WBC: 7 10*3/uL (ref 4.0–10.5)

## 2013-05-24 LAB — HEPATITIS B SURFACE ANTIGEN: HEP B S AG: NEGATIVE

## 2013-05-24 LAB — PHOSPHORUS: PHOSPHORUS: 5.9 mg/dL — AB (ref 2.3–4.6)

## 2013-05-24 LAB — HEPATITIS B CORE ANTIBODY, IGM: HEP B C IGM: NONREACTIVE

## 2013-05-24 SURGERY — INSERTION OF DIALYSIS CATHETER
Anesthesia: Monitor Anesthesia Care | Site: Neck | Laterality: Right

## 2013-05-24 MED ORDER — LIDOCAINE HCL (CARDIAC) 20 MG/ML IV SOLN
INTRAVENOUS | Status: AC
  Filled 2013-05-24: qty 5

## 2013-05-24 MED ORDER — PROPOFOL 10 MG/ML IV BOLUS
INTRAVENOUS | Status: AC
  Filled 2013-05-24: qty 20

## 2013-05-24 MED ORDER — HEPARIN SODIUM (PORCINE) 1000 UNIT/ML DIALYSIS
40.0000 [IU]/kg | Freq: Once | INTRAMUSCULAR | Status: DC
  Filled 2013-05-24: qty 4

## 2013-05-24 MED ORDER — DEXTROSE 5 % IV SOLN
INTRAVENOUS | Status: DC | PRN
  Administered 2013-05-24: 15:00:00 via INTRAVENOUS

## 2013-05-24 MED ORDER — SODIUM CHLORIDE 0.9 % IV SOLN
INTRAVENOUS | Status: DC | PRN
  Administered 2013-05-24: 14:00:00 via INTRAVENOUS

## 2013-05-24 MED ORDER — LIDOCAINE-PRILOCAINE 2.5-2.5 % EX CREA: 1.0000 "application " | TOPICAL_CREAM | CUTANEOUS | Status: DC | PRN

## 2013-05-24 MED ORDER — 0.9 % SODIUM CHLORIDE (POUR BTL) OPTIME
TOPICAL | Status: DC | PRN
  Administered 2013-05-24: 1000 mL

## 2013-05-24 MED ORDER — LIDOCAINE HCL (PF) 1 % IJ SOLN
INTRAMUSCULAR | Status: AC
Start: 2013-05-24 — End: 2013-05-24
  Filled 2013-05-24: qty 30

## 2013-05-24 MED ORDER — LACTATED RINGERS IV SOLN: INTRAVENOUS | Status: DC

## 2013-05-24 MED ORDER — LIDOCAINE HCL (PF) 1 % IJ SOLN
INTRAMUSCULAR | Status: DC | PRN
  Administered 2013-05-24: 30 mL

## 2013-05-24 MED ORDER — HEPARIN SODIUM (PORCINE) 1000 UNIT/ML IJ SOLN
INTRAMUSCULAR | Status: AC
  Filled 2013-05-24: qty 1

## 2013-05-24 MED ORDER — LIDOCAINE HCL (PF) 1 % IJ SOLN: 5.0000 mL | INTRAMUSCULAR | Status: DC | PRN

## 2013-05-24 MED ORDER — ONDANSETRON HCL 4 MG/2ML IJ SOLN
INTRAMUSCULAR | Status: AC
  Filled 2013-05-24: qty 2

## 2013-05-24 MED ORDER — ONDANSETRON HCL 4 MG/2ML IJ SOLN
INTRAMUSCULAR | Status: DC | PRN
  Administered 2013-05-24: 4 mg via INTRAVENOUS

## 2013-05-24 MED ORDER — MORPHINE SULFATE 2 MG/ML IJ SOLN: 1.0000 mg | INTRAMUSCULAR | Status: DC | PRN

## 2013-05-24 MED ORDER — ALTEPLASE 2 MG IJ SOLR: 2.0000 mg | Freq: Once | INTRAMUSCULAR | Status: DC | PRN

## 2013-05-24 MED ORDER — OXYCODONE HCL 5 MG PO TABS: 5.0000 mg | ORAL_TABLET | Freq: Once | ORAL | Status: DC | PRN

## 2013-05-24 MED ORDER — OXYCODONE HCL 5 MG/5ML PO SOLN: 5.0000 mg | Freq: Once | ORAL | Status: DC | PRN

## 2013-05-24 MED ORDER — HEPARIN SODIUM (PORCINE) 1000 UNIT/ML DIALYSIS: 1000.0000 [IU] | INTRAMUSCULAR | Status: DC | PRN

## 2013-05-24 MED ORDER — SODIUM CHLORIDE 0.9 % IR SOLN
Status: DC | PRN
  Administered 2013-05-24: 15:00:00

## 2013-05-24 MED ORDER — HEPARIN SODIUM (PORCINE) 1000 UNIT/ML IJ SOLN
INTRAMUSCULAR | Status: DC | PRN
  Administered 2013-05-24: 5000 [IU] via INTRAVENOUS

## 2013-05-24 MED ORDER — PENTAFLUOROPROP-TETRAFLUOROETH EX AERO: 1.0000 "application " | INHALATION_SPRAY | CUTANEOUS | Status: DC | PRN

## 2013-05-24 MED ORDER — SODIUM CHLORIDE 0.9 % IV SOLN: INTRAVENOUS | Status: DC | PRN

## 2013-05-24 MED ORDER — PROPOFOL INFUSION 10 MG/ML OPTIME
INTRAVENOUS | Status: DC | PRN
  Administered 2013-05-24: 25 ug/kg/min via INTRAVENOUS

## 2013-05-24 MED ORDER — SODIUM CHLORIDE 0.9 % IV SOLN
INTRAVENOUS | Status: DC
  Administered 2013-05-25: via INTRAVENOUS

## 2013-05-24 MED ORDER — HEPARIN SODIUM (PORCINE) 1000 UNIT/ML IJ SOLN
INTRAMUSCULAR | Status: DC | PRN
  Administered 2013-05-24: 1000 [IU] via INTRAVENOUS

## 2013-05-24 MED ORDER — LIDOCAINE HCL (CARDIAC) 20 MG/ML IV SOLN
INTRAVENOUS | Status: DC | PRN
  Administered 2013-05-24: 100 mg via INTRAVENOUS

## 2013-05-24 MED ORDER — FENTANYL CITRATE 0.05 MG/ML IJ SOLN
INTRAMUSCULAR | Status: AC
  Filled 2013-05-24: qty 5

## 2013-05-24 MED ORDER — PROMETHAZINE HCL 25 MG/ML IJ SOLN: 6.2500 mg | INTRAMUSCULAR | Status: DC | PRN

## 2013-05-24 MED ORDER — NEPRO/CARBSTEADY PO LIQD: 237.0000 mL | ORAL | Status: DC | PRN

## 2013-05-24 MED ORDER — SODIUM CHLORIDE 0.9 % IV SOLN: 100.0000 mL | INTRAVENOUS | Status: DC | PRN

## 2013-05-24 MED ORDER — FENTANYL CITRATE 0.05 MG/ML IJ SOLN
INTRAMUSCULAR | Status: DC | PRN
  Administered 2013-05-24: 50 ug via INTRAVENOUS
  Administered 2013-05-24 (×2): 25 ug via INTRAVENOUS

## 2013-05-24 SURGICAL SUPPLY — 62 items
ARMBAND PINK RESTRICT EXTREMIT (MISCELLANEOUS) ×3 IMPLANT
BAG DECANTER FOR FLEXI CONT (MISCELLANEOUS) ×3 IMPLANT
CANISTER SUCTION 2500CC (MISCELLANEOUS) ×3 IMPLANT
CATH CANNON HEMO 15F 50CM (CATHETERS) IMPLANT
CATH CANNON HEMO 15FR 19 (HEMODIALYSIS SUPPLIES) IMPLANT
CATH CANNON HEMO 15FR 23CM (HEMODIALYSIS SUPPLIES) ×3 IMPLANT
CATH CANNON HEMO 15FR 31CM (HEMODIALYSIS SUPPLIES) IMPLANT
CATH CANNON HEMO 15FR 32CM (HEMODIALYSIS SUPPLIES) IMPLANT
CATH HEADHUNTER 5FR 65CM (MISCELLANEOUS) ×3 IMPLANT
CATH STRAIGHT 5FR 65CM (CATHETERS) IMPLANT
CHLORAPREP W/TINT 26ML (MISCELLANEOUS) ×3 IMPLANT
CLIP TI MEDIUM 6 (CLIP) ×3 IMPLANT
CLIP TI WIDE RED SMALL 6 (CLIP) ×3 IMPLANT
COVER PROBE W GEL 5X96 (DRAPES) ×6 IMPLANT
COVER SURGICAL LIGHT HANDLE (MISCELLANEOUS) ×3 IMPLANT
DECANTER SPIKE VIAL GLASS SM (MISCELLANEOUS) ×3 IMPLANT
DERMABOND ADHESIVE PROPEN (GAUZE/BANDAGES/DRESSINGS) ×2
DERMABOND ADVANCED (GAUZE/BANDAGES/DRESSINGS) ×1
DERMABOND ADVANCED .7 DNX12 (GAUZE/BANDAGES/DRESSINGS) ×2 IMPLANT
DERMABOND ADVANCED .7 DNX6 (GAUZE/BANDAGES/DRESSINGS) ×4 IMPLANT
DRAIN PENROSE 1/4X12 LTX STRL (WOUND CARE) ×3 IMPLANT
DRAPE C-ARM 42X72 X-RAY (DRAPES) ×3 IMPLANT
DRAPE CHEST BREAST 15X10 FENES (DRAPES) ×3 IMPLANT
ELECT REM PT RETURN 9FT ADLT (ELECTROSURGICAL) ×3
ELECTRODE REM PT RTRN 9FT ADLT (ELECTROSURGICAL) ×2 IMPLANT
GAUZE SPONGE 2X2 8PLY STRL LF (GAUZE/BANDAGES/DRESSINGS) ×2 IMPLANT
GAUZE SPONGE 4X4 16PLY XRAY LF (GAUZE/BANDAGES/DRESSINGS) ×3 IMPLANT
GEL ULTRASOUND 20GR AQUASONIC (MISCELLANEOUS) ×3 IMPLANT
GLOVE BIO SURGEON STRL SZ7.5 (GLOVE) ×3 IMPLANT
GOWN STRL REUS W/ TWL LRG LVL3 (GOWN DISPOSABLE) ×6 IMPLANT
GOWN STRL REUS W/TWL LRG LVL3 (GOWN DISPOSABLE) ×3
KIT BASIN OR (CUSTOM PROCEDURE TRAY) ×3 IMPLANT
KIT ROOM TURNOVER OR (KITS) ×3 IMPLANT
LOOP VESSEL MINI RED (MISCELLANEOUS) IMPLANT
NEEDLE 18GX1X1/2 (RX/OR ONLY) (NEEDLE) ×3 IMPLANT
NEEDLE 22X1 1/2 (OR ONLY) (NEEDLE) ×3 IMPLANT
NEEDLE HYPO 25GX1X1/2 BEV (NEEDLE) ×3 IMPLANT
NS IRRIG 1000ML POUR BTL (IV SOLUTION) ×3 IMPLANT
PACK CV ACCESS (CUSTOM PROCEDURE TRAY) ×3 IMPLANT
PACK SURGICAL SETUP 50X90 (CUSTOM PROCEDURE TRAY) ×3 IMPLANT
PAD ARMBOARD 7.5X6 YLW CONV (MISCELLANEOUS) ×6 IMPLANT
SET MICROPUNCTURE 5F STIFF (MISCELLANEOUS) IMPLANT
SPONGE GAUZE 2X2 STER 10/PKG (GAUZE/BANDAGES/DRESSINGS) ×1
SPONGE SURGIFOAM ABS GEL 100 (HEMOSTASIS) IMPLANT
SUT ETHILON 3 0 PS 1 (SUTURE) ×3 IMPLANT
SUT PROLENE 6 0 CC (SUTURE) ×3 IMPLANT
SUT PROLENE 7 0 BV 1 (SUTURE) ×3 IMPLANT
SUT VIC AB 3-0 SH 27 (SUTURE) ×1
SUT VIC AB 3-0 SH 27X BRD (SUTURE) ×2 IMPLANT
SUT VICRYL 4-0 PS2 18IN ABS (SUTURE) ×3 IMPLANT
SYR 20CC LL (SYRINGE) ×6 IMPLANT
SYR 30ML LL (SYRINGE) IMPLANT
SYR 5ML LL (SYRINGE) ×6 IMPLANT
SYR CONTROL 10ML LL (SYRINGE) ×3 IMPLANT
SYRINGE 10CC LL (SYRINGE) ×3 IMPLANT
TAPE CLOTH SURG 4X10 WHT LF (GAUZE/BANDAGES/DRESSINGS) ×3 IMPLANT
TOWEL OR 17X24 6PK STRL BLUE (TOWEL DISPOSABLE) ×3 IMPLANT
TOWEL OR 17X26 10 PK STRL BLUE (TOWEL DISPOSABLE) ×3 IMPLANT
UNDERPAD 30X30 INCONTINENT (UNDERPADS AND DIAPERS) ×3 IMPLANT
WATER STERILE IRR 1000ML POUR (IV SOLUTION) ×3 IMPLANT
WIRE AMPLATZ SS-J .035X180CM (WIRE) IMPLANT
WIRE AMPLATZ SS-J .035X260CM (WIRE) ×3 IMPLANT

## 2013-05-24 NOTE — Progress Notes (Signed)
[facility] PROGRESS NOTE  Robert Weber ZOX:[id number] DOB: 06/12/1931 DOA: 05/18/2013 PCP: No primary provider on file.  Assessment/Plan: Principal Problem:  Fracture of femoral neck, left  -Reconsulted ortho 4/2 Dr Duda>> taken to OR on 4/3 and status post left hemiarthroplasty -appreciate Dr Audrie Liauda's assisatance -Bleeding from incision overnight 4/3>> now resolved -continue pain mangement -PT recommending SNF for rehabilitation Active Problems:  CKD (chronic kidney disease) stage 5, GFR less than 15 ml/min>> now ESRD per renal -Appreciate Dr. Deterding's assistance>> catheter to be placed and vascular consulted for permanent access>> then dialysis for renal -Creatinine 7.8 today with BUN of 103. K4.4 with CO2 of 25 Postop anemia-acute blood loss -Status post Transfusion 2 units packed red blood cells>> hemoglobin 7.2 on 4/5 -Hemoglobin 6.6 4/6.>> Transfused another unit of packed red blood cells and hemoglobin 7.5 this a.m.     Code Status: full Family Communication: none at bedside  Disposition Plan: likely will need SNF   Consultants:  ORTHO- Dr Lajoyce Cornersuda  renal  Procedures:  none  Antibiotics:  none   HPI/Subjective: States he feels weak, denies chest pain and no shortness of breath  Objective: Filed Vitals:   05/24/13 1224  BP: 109/44  Pulse: 72  Temp: 98.8 F (37.1 C)  Resp: 18    Intake/Output Summary (Last 24 hours) at 05/24/13 1446 Last data filed at 05/24/13 1225  Gross per 24 hour  Intake 336.33 ml  Output   1925 ml  Net -1588.67 ml   Filed Weights   05/18/13 2138 05/19/13 2025 05/20/13 1106  Weight: 80.015 kg (176 lb 6.4 oz) 79.1 kg (174 lb 6.1 oz) 83.5 kg (184 lb 1.4 oz)    Exam:  General: alert & oriented x 3 In NAD Cardiovascular: RRR, nl S1 s2 Respiratory: CTAB Abdomen: soft +BS NT/ND, no masses palpable Extremities: No cyanosis and no edema     Data Reviewed: Basic Metabolic Panel:  Recent Labs Lab 05/20/13 0550  05/21/13 0430 05/22/13 0626 05/23/13 0455 05/24/13 0321  NA 143 145 142 144 143  K 4.2 4.2 4.2 4.3 4.4  CL 104 104 98 99 100  CO2 21 22 25 26 25   GLUCOSE 101* 169* 115* 107* 106*  BUN 69* 75* 90* 100* 103*  CREATININE 5.88* 6.61* 8.11* 8.04* 7.48*  CALCIUM 9.2 8.1* 8.0* 8.4 8.6  PHOS 6.0* 6.6* 7.7* 6.9* 5.9*   Liver Function Tests:  Recent Labs Lab 05/20/13 0550 05/21/13 0430 05/22/13 0626 05/23/13 0455 05/24/13 0321  AST  --   --   --   --  29  ALT  --   --   --   --  <5  ALKPHOS  --   --   --   --  48  BILITOT  --   --   --   --  0.4  PROT  --   --   --   --  5.4*  ALBUMIN 3.0* 2.4* 2.0* 1.9* 2.0*   No results found for this basename: LIPASE, AMYLASE,  in the last 168 hours No results found for this basename: AMMONIA,  in the last 168 hours CBC:  Recent Labs Lab 05/20/13 0550 05/21/13 0430 05/22/13 0626 05/23/13 0455 05/24/13 0321  WBC 7.6 8.7 7.4 6.8 7.0  HGB 9.8* 6.8* 7.2* 6.6* 7.5*  HCT 29.4* 20.4* 20.7* 19.6* 22.5*  MCV 98.7 97.1 92.4 94.7 93.8  PLT 176 179 124* 145* 162   Cardiac Enzymes: No results found for this basename: CKTOTAL, CKMB, CKMBINDEX, TROPONINI,  in the last 168 hours BNP (last 3 results) No results found for this basename: PROBNP,  in the last 8760 hours CBG: No results found for this basename: GLUCAP,  in the last 168 hours  Recent Results (from the past 240 hour(s))  SURGICAL PCR SCREEN     Status: None   Collection Time    05/20/13  6:00 AM      Result Value Ref Range Status   MRSA, PCR NEGATIVE  NEGATIVE Final   Staphylococcus aureus NEGATIVE  NEGATIVE Final   Comment:            The Xpert SA Assay (FDA     approved for NASAL specimens     in patients over 63 years of age),     is one component of     a comprehensive surveillance     program.  Test performance has     been validated by [employer] for patients greater     than or equal to 60 year old.     It is not intended     to diagnose infection nor to      guide or monitor treatment.     Studies: No results found.  Scheduled Meds: . [facility] HOLD] aspirin EC  325 mg Oral Q breakfast  . [MAR HOLD] calcitRIOL  0.25 mcg Oral Daily  . [MAR HOLD] cefUROXime (ZINACEF)  IV  1.5 g Intravenous On Call to OR  . [MAR HOLD] darbepoetin (ARANESP) injection - NON-DIALYSIS  150 mcg Subcutaneous Q Sun-1800  . [facility] HOLD] ferric gluconate (FERRLECIT/NULECIT) IV  125 mg Intravenous Daily  . [facility] HOLD] levothyroxine  100 mcg Oral QAC breakfast  . [MAR HOLD] multivitamin  1 tablet Oral QHS  . [facility] HOLD] simvastatin  20 mg Oral q1800   Continuous Infusions: . sodium chloride 20 mL/hr (05/24/13 1407)  . sodium chloride      Principal Problem:   Fracture of femoral neck, left Active Problems:   CKD (chronic kidney disease) stage 5, GFR less than 15 ml/min   Hip fracture    Time spent: 25    [facility]  [facility] Pager [phone]. If 7PM-7AM, please contact night-coverage at [email or web address], password [facility] 05/24/2013, 2:46 PM  LOS: 6 days              and failure

## 2013-05-24 NOTE — Op Note (Signed)
Procedure: Left brachial vein fistula first stage, Ultrasound guidance, Diatek catheter  Preoperative diagnosis: End-stage renal disease  Postoperative diagnosis: Same  Anesthesia: Local with IV sedation  Operative findings: 5 mm brachial vein, 4 mm left brachial artery   23 cm Diatek catheter right internal jugular vein  Operative details: After obtaining informed consent, the patient was taken to the operating room. The patient was placed in supine position on the operating room table. After adequate sedation the patient's entire neck and chest were prepped and draped in usual sterile fashion. The patient was placed in Trendelenburg position. Ultrasound was used to identify the patient's right internal jugular vein. This had normal compressibility and respiratory variation. Local anesthesia was infiltrated over the right jugular vein.  Using ultrasound guidance, the right internal jugular vein was successfully cannulated.  A 0.035 J-tipped guidewire was threaded into the right internal jugular vein and into the superior vena cava and into the right atrium.  The patient had a very irritable ventricle and kept firing salvos of PVCs. I felt it would be better to get the wire into the IVC to avoid the arrhythmia.  The wire would not initially advance into the IVC so a 5 Fr H1 catheter was placed over the wire to direct it into the IVC.  The catheter was then advanced into the IVC and the wire exchanged for an 035 Amplatz wire.  The H1 catheter was then removed. Next sequential 12 and 14 dilators were placed over the guidewire into the right atrium.  A 16 French dilator with a peel-away sheath was then placed over the guidewire into the right atrium.  The dilator was removed. The 23 cm Diatek catheter was placed using a weave technique over the wire. A The catheter was then tunneled subcutaneously, cut to length, and the hub attached. The catheter was noted to flush and draw easily. The catheter was  inspected under fluoroscopy and found with its tip to be in the right atrium without any kinks throughout its course. The catheter was sutured to the skin with nylon sutures. The neck insertion site was closed with Vicryl stitch. The catheter was then loaded with concentrated Heparin solution. A dry sterile dressing was applied.  Next, the patient's entire left upper extremity was prepped and draped in the usual sterile fashion. Next ultrasound was used to attempt to identify the left basilic vein.  There was no reasonable size vein.  A longitudinal incision was made just above the antecubital crease in order to expose the brachial artery and an adjacent deep brachial vein. The vein was of good quality approximately 5 mm in diameter.  Care was taken to try to not injure any sensory nerves and all the motor nerves were identified and protected. The vein was dissected free circumferentially and small side branches ligated and divided between silk ties or clips. Next the brachial artery was exposed. The artery was approximately 4 mm in diameter with some spasm. This was dissected free circumferentially. Vessel loops were placed around it. There was some spasm within the artery after dissecting it free. Next the distal brachial vein was ligated with a 2 silk tie and the vein transected.  The patient was given 5000 units of intravenous heparin. Vessel loops were used to control the artery proximally and distally. A longitudinal arteriotomy was made with an 11 blade.  The vein was cut to length and sewn end of vein to side of artery using a running 7-0 Prolene suture. Just prior to  completion of the anastomosis, it was forebled backbled and thoroughly flushed. The anastomosis was secured and the Vesseloops were released.  There was a palpable thrill in the proximal aspect of the fistula. There was also good Doppler flow throughout the course of the fistula. At this point all subcutaneous tissues were reapproximated  using running 3-0 Vicryl suture. All skin incisions were closed with running 4  0 Vicryl subcuticular stitch. Dermabond was applied to all incisions. The patient tolerated the procedure well and there were no complications. Instrument sponge needle counts were correct at the end of the case. The patient was taken to the recovery room in stable condition.  The patient had a palpable radial pulse at the end of the case.  Fabienne Bruns, MD [facility] [place] Office: [phone] Pager: [phone]

## 2013-05-24 NOTE — OR Nursing (Signed)
Procedure 1 - Dialysis catheter ended at 1544

## 2013-05-24 NOTE — Interval H&P Note (Signed)
History and Physical Interval Note:  05/24/2013 12:42 PM  Robert Weber  has presented today for surgery, with the diagnosis of ESRD  The various methods of treatment have been discussed with the patient and family. After consideration of risks, benefits and other options for treatment, the patient has consented to  Procedure(s): ARTERIOVENOUS (AV) FISTULA CREATION (Left) INSERTION OF DIALYSIS CATHETER (Right) as a surgical intervention .  The patient's history has been reviewed, patient examined, no change in status, stable for surgery.  I have reviewed the patient's chart and labs.  Questions were answered to the patient's satisfaction.     FIELDS,CHARLES E

## 2013-05-24 NOTE — Anesthesia Preprocedure Evaluation (Addendum)
Anesthesia Evaluation  Patient identified by MRN, date of birth, ID band Patient awake    Reviewed: Allergy & Precautions, H&P , NPO status , Patient's Chart, lab work & pertinent test results, reviewed documented beta blocker date and time   Airway Mallampati: II TM Distance: >3 FB Neck ROM: full    Dental  (+) Teeth Intact, Dental Advidsory Given   Pulmonary neg pulmonary ROS,  breath sounds clear to auscultation        Cardiovascular hypertension, On Medications and Pt. on medications Rhythm:Regular Rate:Normal     Neuro/Psych    GI/Hepatic   Endo/Other    Renal/GU ESRFRenal diseasePending HD     Musculoskeletal   Abdominal   Peds  Hematology   Anesthesia Other Findings   Reproductive/Obstetrics                          Anesthesia Physical Anesthesia Plan  ASA: III  Anesthesia Plan: MAC   Post-op Pain Management:    Induction: Intravenous  Airway Management Planned: Natural Airway and Simple Face Mask  Additional Equipment:   Intra-op Plan:   Post-operative Plan: Extubation in OR  Informed Consent: I have reviewed the patients History and Physical, chart, labs and discussed the procedure including the risks, benefits and alternatives for the proposed anesthesia with the patient or authorized representative who has indicated his/her understanding and acceptance.   Dental advisory given  Plan Discussed with: CRNA and Surgeon  Anesthesia Plan Comments:         Anesthesia Quick Evaluation

## 2013-05-24 NOTE — Transfer of Care (Signed)
Immediate Anesthesia Transfer of Care Note  Patient: Robert Weber  Procedure(s) Performed: Procedure(s): INSERTION OF DIALYSIS CATHETER with Intraop Ultrasound (Right) Creation Left Arm Arteriovenous Fistula with Intraop Ultrasound (Left)  Patient Location: PACU  Anesthesia Type:MAC  Level of Consciousness: awake, alert  and patient cooperative  Airway & Oxygen Therapy: Patient Spontanous Breathing and Patient connected to nasal cannula oxygen  Post-op Assessment: Report given to PACU RN and Post -op Vital signs reviewed and stable  Post vital signs: Reviewed and stable  Complications: No apparent anesthesia complications

## 2013-05-24 NOTE — Clinical Social Work Note (Signed)
Progress note reviewed and approved.  Hayzel Ruberg, MSW, LCSW [phone] 

## 2013-05-24 NOTE — Progress Notes (Signed)
Subjective: Interval History: has no complaint. Doing fine.  Objective: Vital signs in last 24 hours: Temp:  [98.2 F (36.8 C)-99.2 F (37.3 C)] 98.7 F (37.1 C) (04/07 0900) Pulse Rate:  [67-76] 74 (04/07 0900) Resp:  [16-20] 20 (04/07 0900) BP: (101-132)/(53-69) 116/53 mmHg (04/07 0900) SpO2:  [89 %-96 %] 96 % (04/07 0900) Weight change:   Intake/Output from previous day: 04/06 0701 - 04/07 0700 In: 1031.3 [I.V.:586.3; Blood:335; IV Piggyback:110] Out: 1650 [Urine:1650] Intake/Output this shift:    General appearance: alert, cooperative and pale Resp: diminished breath sounds bilaterally Cardio: S1, S2 normal and systolic murmur: holosystolic 2/6, blowing at apex GI: pos bs, ,soft, liver down 5 cm  Extremities: swollen hip area where op  Lab Results:  Recent Labs  05/23/13 0455 05/24/13 0321  WBC 6.8 7.0  HGB 6.6* 7.5*  HCT 19.6* 22.5*  PLT 145* 162   BMET:  Recent Labs  05/23/13 0455 05/24/13 0321  NA 144 143  K 4.3 4.4  CL 99 100  CO2 26 25  GLUCOSE 107* 106*  BUN 100* 103*  CREATININE 8.04* 7.48*  CALCIUM 8.4 8.6   No results found for this basename: PTH,  in the last 72 hours Iron Studies: No results found for this basename: IRON, TIBC, TRANSFERRIN, FERRITIN,  in the last 72 hours  Studies/Results: No results found.  I have reviewed the patient's current medications.  Assessment/Plan: 1 CKD now ESRD for PC, access, HD 2 Anemia ok after transfusion cont epo 3 HPTH meds 4 Hip fx  Per ortho P PC, access, HD. Review meds.    LOS: 6 days   Loriann Bosserman L 05/24/2013,12:01 PM

## 2013-05-24 NOTE — H&P (View-Only) (Signed)
    Subjective  -  No complaints today No SOB   Physical Exam:  Pulm: CTA Ext palp radial pulse on left bilat edema       Assessment/Plan:  ESRD  I reviewed his vein map.  He does no have adequate cephalic vein bilaterally.  I would plan on L BVT, possible staged.  His BUN increase to 100  From 90 todau.  Cr. Unchanged at 8.  If needs TDC, I can do this tomorrow in conjunction with AVF.  Please contact me if this is required  BRABHAM IV, V. WELLS 05/23/2013 7:34 AM --  Filed Vitals:   05/23/13 0424  BP: 111/63  Pulse: 78  Temp: 99.6 F (37.6 C)  Resp: 20    Intake/Output Summary (Last 24 hours) at 05/23/13 0734 Last data filed at 05/23/13 0700  Gross per 24 hour  Intake 594.33 ml  Output   1650 ml  Net -1055.67 ml     Laboratory CBC    Component Value Date/Time   WBC 6.8 05/23/2013 0455   HGB 6.6* 05/23/2013 0455   HCT 19.6* 05/23/2013 0455   PLT 145* 05/23/2013 0455    BMET    Component Value Date/Time   NA 144 05/23/2013 0455   K 4.3 05/23/2013 0455   CL 99 05/23/2013 0455   CO2 26 05/23/2013 0455   GLUCOSE 107* 05/23/2013 0455   BUN 100* 05/23/2013 0455   CREATININE 8.04* 05/23/2013 0455   CALCIUM 8.4 05/23/2013 0455   GFRNONAA 6* 05/23/2013 0455   GFRAA 6* 05/23/2013 0455    COAG No results found for this basename: INR, PROTIME   No results found for this basename: PTT    Antibiotics Anti-infectives   Start     Dose/Rate Route Frequency Ordered Stop   05/20/13 1115  ceFAZolin (ANCEF) IVPB 2 g/50 mL premix     2 g 100 mL/hr over 30 Minutes Intravenous Every 6 hours 05/20/13 1105 05/20/13 1840   05/20/13 0600  ceFAZolin (ANCEF) IVPB 2 g/50 mL premix     2 g 100 mL/hr over 30 Minutes Intravenous On call to O.R. 05/20/13 0515 05/20/13 0803       V. Wells Brabham IV, M.D. [facility] [place] Office: [phone] Pager:  [phone]  

## 2013-05-24 NOTE — Anesthesia Postprocedure Evaluation (Signed)
Anesthesia Post Note  Patient: Robert Weber  Procedure(s) Performed: Procedure(s) (LRB): INSERTION OF DIALYSIS CATHETER with Intraop Ultrasound (Right) Creation Left Arm Arteriovenous Fistula with Intraop Ultrasound (Left)  Anesthesia type: MAC  Patient location: PACU  Post pain: Pain level controlled  Post assessment: Patient's Cardiovascular Status Stable  Last Vitals:  Filed Vitals:   05/24/13 1715  BP:   Pulse: 78  Temp:   Resp: 17    Post vital signs: Reviewed and stable  Level of consciousness: alert  Complications: No apparent anesthesia complications

## 2013-05-24 NOTE — Clinical Social Work Note (Signed)
CSW intern talked with admissions director, The[facility]ise [facility]. Mrs. Robert Weber advised CSW intern that the facility can make an offer but realized the patient is ESRD and wanted to confirm with CSW intern. Mrs. Robert Weber also advised CSW intern that if the patient's HD schedule was MWF, the facility would be able to transport him. If the patient's schedule was TTS, the patient would need a family or friend to transport him back and forth. CSW intern then talked with the patient and the patient is happy with [facility] and would have a family member transport him if his schedule was TTS. Patient has no other needs at the current moment and CSW will follow up with d/c plans [facility] when medically ready.    Robert Weber, CSW Intern.

## 2013-05-25 ENCOUNTER — Inpatient Hospital Stay (HOSPITAL_COMMUNITY): Payer: Medicare Other

## 2013-05-25 ENCOUNTER — Telehealth: Payer: Self-pay | Admitting: Vascular Surgery

## 2013-05-25 LAB — RENAL FUNCTION PANEL
Albumin: 2.2 g/dL — ABNORMAL LOW (ref 3.5–5.2)
BUN: 71 mg/dL — ABNORMAL HIGH (ref 6–23)
CO2: 22 meq/L (ref 19–32)
Calcium: 8.9 mg/dL (ref 8.4–10.5)
Chloride: 101 meq/L (ref 96–112)
Creatinine, Ser: 5.81 mg/dL — ABNORMAL HIGH (ref 0.50–1.35)
GFR calc Af Amer: 10 mL/min — ABNORMAL LOW
GFR calc non Af Amer: 8 mL/min — ABNORMAL LOW
Glucose, Bld: 97 mg/dL (ref 70–99)
Phosphorus: 6.7 mg/dL — ABNORMAL HIGH (ref 2.3–4.6)
Potassium: 4.6 meq/L (ref 3.7–5.3)
Sodium: 143 meq/L (ref 137–147)

## 2013-05-25 LAB — CBC
HCT: 25 % — ABNORMAL LOW (ref 39.0–52.0)
Hemoglobin: 8.2 g/dL — ABNORMAL LOW (ref 13.0–17.0)
MCH: 31.4 pg (ref 26.0–34.0)
MCHC: 32.8 g/dL (ref 30.0–36.0)
MCV: 95.8 fL (ref 78.0–100.0)
Platelets: 197 10*3/uL (ref 150–400)
RBC: 2.61 MIL/uL — ABNORMAL LOW (ref 4.22–5.81)
RDW: 15.5 % (ref 11.5–15.5)
WBC: 8.3 10*3/uL (ref 4.0–10.5)

## 2013-05-25 MED ORDER — HEPARIN SODIUM (PORCINE) 5000 UNIT/ML IJ SOLN
5000.0000 [IU] | Freq: Three times a day (TID) | INTRAMUSCULAR | Status: DC
  Administered 2013-05-25 – 2013-05-26 (×5): 5000 [IU] via SUBCUTANEOUS
  Filled 2013-05-25 (×6): qty 1

## 2013-05-25 NOTE — Progress Notes (Signed)
[facility].

## 2013-05-25 NOTE — Progress Notes (Signed)
Physical Therapy Treatment Patient Details Name: Robert Weber MRN: [id number] DOB: 08/27/1931 Today's Date: 05/25/2013    History of Present Illness Pt admit with fracture of femoral neck fracture of left LE.  Posterior hip precautions.     PT Comments    Patient continues with poor balance sitting and standing with trunk weakness evident.  Stood x 1 today prior to fatigue and sat edge of bed 10 + minutes for balance and core strength work.  Will need SNF rehab prior to d/c.  Follow Up Recommendations  SNF;Supervision/Assistance - 24 hour     Equipment Recommendations  Other (comment) (TBA)    Recommendations for Other Services       Precautions / Restrictions Precautions Precautions: Fall;Posterior Hip Restrictions LLE Weight Bearing: Weight bearing as tolerated    Mobility  Bed Mobility Overal bed mobility: Needs Assistance;+2 for physical assistance Bed Mobility: Sit to Supine;Supine to Sit;Rolling Rolling: Max assist   Supine to sit: +2 for physical assistance;Max assist Sit to supine: +2 for physical assistance;Max assist   General bed mobility comments: used OHT for lifting hips to assist with scooting, +2 for lifting trunk; to supine assist for trunk and legs in bed; rolled for hygiene (painful to left)  Transfers Overall transfer level: Needs assistance Equipment used: Rolling walker (2 wheeled) Transfers: Sit to/from Stand Sit to Stand: +2 physical assistance;Max assist;From elevated surface         General transfer comment: stood from elevated bed with lifting assist, cues for right leg and arm extension and to bring hips forward; stood about 30 seconds max with cues  Ambulation/Gait                 Stairs            Wheelchair Mobility    Modified Rankin (Stroke Patients Only)       Balance Overall balance assessment: Needs assistance   Sitting balance-Leahy Scale: Poor Sitting balance - Comments: grasping at bedrail and mattress for  support with weak trunk and posterior pelvic tilt; sat edge of bed x 10 minutes works on coming forward without UE assist, upper back strengthening with rows and ant pelvic tilt 5-10 reps each Postural control: Posterior lean;Left lateral lean   Standing balance-Leahy Scale: Zero Standing balance comment: stood x 1 for about 30 seconds max of 2 and walker for support                    Cognition Arousal/Alertness: Awake/alert Behavior During Therapy: WFL for tasks assessed/performed Overall Cognitive Status: Within Functional Limits for tasks assessed                      Exercises Total Joint Exercises Ankle Circles/Pumps: AROM;20 reps;Supine Heel Slides: AROM;AAROM;Both;Supine    General Comments        Pertinent Vitals/Pain Left hip pain with movement    Home Living                      Prior Function            PT Goals (current goals can now be found in the care plan section) Progress towards PT goals: Progressing toward goals    Frequency  Min 3X/week    PT Plan Current plan remains appropriate    Co-evaluation             End of Session Equipment Utilized During Treatment: Gait belt Activity Tolerance: Patient limited  by fatigue Patient left: in bed;with call bell/phone within reach     Time: 1429-1508 PT Time Calculation (min): 39 min  Charges:  $Therapeutic Exercise: 8-22 mins $Therapeutic Activity: 23-37 mins                    G Codes:      Ane Payment 2013/06/10, 4:36 PM Robert Weber, PT [phone] 06/10/13

## 2013-05-25 NOTE — Telephone Encounter (Addendum)
Message copied by Rosalyn ChartersOUX, BONNIE A on Wed May 25, 2013 10:38 AM ------      Message from: Sherren KernsFIELDS, CHARLES E      Created: Tue May 24, 2013  5:02 PM       Ultrasound neck and left arm      Diatek catheter      Left brachial vein to brachial artery fistula            No asst            He needs a follow up appt in 6 weeks with duplex of fistula at that time            Leonette Mostharles ------ unable to reach patient by phone, mailed appt. letter notifying patient of post op appt. on 07-07-13 at 1pm

## 2013-05-25 NOTE — Progress Notes (Signed)
Subjective: Interval History: has complaints weak, but getting up.  Objective: Vital signs in last 24 hours: Temp:  [98.3 F (36.8 C)-99.3 F (37.4 C)] 99.3 F (37.4 C) (04/08 1022) Pulse Rate:  [72-94] 84 (04/08 1022) Resp:  [14-18] 18 (04/08 1022) BP: (87-122)/(39-56) 112/56 mmHg (04/08 1022) SpO2:  [90 %-99 %] 93 % (04/08 1022) Weight:  [79.4 kg (175 lb 0.7 oz)-82.9 kg (182 lb 12.2 oz)] 79.4 kg (175 lb 0.7 oz) (04/07 2213) Weight change:   Intake/Output from previous day: 04/07 0701 - 04/08 0700 In: 650 [P.O.:200; I.V.:450] Out: 3212 [Urine:1000; Blood:25] Intake/Output this shift: Total I/O In: 240 [P.O.:240] Out: -   General appearance: alert, cooperative, mildly obese and pale Resp: diminished breath sounds bilaterally Chest wall: R IJ cath Cardio: S1, S2 normal and systolic murmur: holosystolic 2/6, blowing at apex GI: soft pos bs. liver down 3 cm,  Extremities: AVF LUA B&T, L hip very swollen  Lab Results:  Recent Labs  05/24/13 0321 05/25/13 0610  WBC 7.0 8.3  HGB 7.5* 8.2*  HCT 22.5* 25.0*  PLT 162 197   BMET:  Recent Labs  05/24/13 0321 05/25/13 0610  NA 143 143  K 4.4 4.6  CL 100 101  CO2 25 22  GLUCOSE 106* 97  BUN 103* 71*  CREATININE 7.48* 5.81*  CALCIUM 8.6 8.9   No results found for this basename: PTH,  in the last 72 hours Iron Studies: No results found for this basename: IRON, TIBC, TRANSFERRIN, FERRITIN,  in the last 72 hours  Studies/Results: Dg Chest Port 1 View  05/24/2013   CLINICAL DATA:  Central line placement.  Chronic kidney disease.  EXAM: PORTABLE CHEST - 1 VIEW  COMPARISON:  05/19/2013  FINDINGS: Double-lumen central venous catheter is been inserted and the tips are in good position in the right atrium. Heart size is normal. There is prominence of the left hilum which may represent enlargement of the main pulmonary artery but this appears more prominent than on the study of 04/02/2010. The possibility of adenopathy should be  considered.  Pulmonary vascularity is normal. Lungs are clear. The patient has a prominent left pericardial fat pad. No acute osseous abnormality. No pneumothorax.  IMPRESSION: 1. No pneumothorax after central line placement. 2. Central line appears in good position. 3. Abnormal enlargement of the left hilum. The possibility of a mass or adenopathy should be considered. This represents change from the study of 04/02/2010.   Electronically Signed   By: Geanie CooleyJim  Maxwell M.D.   On: 05/24/2013 18:36   Dg Fluoro Guide Cv Line-no Report  05/24/2013   CLINICAL DATA: diatek cath   FLOURO GUIDE CV LINE  Fluoroscopy was utilized by the requesting physician.  No radiographic  interpretation.     I have reviewed the patient's current medications.  Assessment/Plan: 1 CRF  1st HD yest, will do in am. Slow.  Getting CLIPPED.  Need to mobilize.   2 Anemia stable, epo/fe 3 Hip fx, mobilize 4 HPTH  5 ^ Lipids P mobilize, epo, FE , Clip  LOS: 7 days   Janeen Watson L Hady Niemczyk 05/25/2013,11:01 AM

## 2013-05-25 NOTE — Progress Notes (Signed)
NUTRITION FOLLOW-UP  DOCUMENTATION CODES Per approved criteria  -Not Applicable   INTERVENTION: RD reviewed renal education during this visit with patient and visitors. Encouraged adequate PO intake. RD to continue to follow nutrition care plan.  NUTRITION DIAGNOSIS: Predicted sub optimal energy intake related to scheduled surgery as evidenced by Robert Weber's chart.   Goal: Robert Weber to meet >/= 90% of their estimated nutrition needs   Monitor:  PO intake post surgery, weight trend, labs  ASSESSMENT: 80 y.o. male who lost his ballance and fell at home. He landed on his left hip. Had severe left hip pain, worse with movement, better at rest. As a result he [facility]. Robert Weber has history of HTN and CKD 4/5.   Underwent L hip arthroplasty on 4/3. Developed post-op ABLA and required PRBC transfusions. Robert Weber also developed worsening renal failure and decreased UOP. Robert Weber with hx of prior L AVF that failed. Underwent AF fistula creation and insertion of HD catheter on 4/7. Underwent first HD treatment last night.  Eating 100% of meals at this time.  Robert Weber with bananas and other non renal-friendly foods at bedside (blocks of cheese.) Discussed that these foods are unsafe for patient to consume. Provided information on high phosphorus and high potassium foods that should be avoided, Robert Weber verbalized understanding.  Potassium WNL Phosphorus elevated at 6.7  Height: Ht Readings from Last 1 Encounters:  05/18/13 6' (1.829 m)    Weight: Wt Readings from Last 1 Encounters:  05/24/13 175 lb 0.7 oz (79.4 kg)  Admit body weight 176 lb  BMI:  Body mass index is 23.74 kg/(m^2). WNL  Estimated Nutritional Needs: Kcal: 1900-2100 Protein: 95 - 105 grams Fluid: 1.2 L/day  Skin:  L hip incision Neck incision L arm incision  Diet Order: Renal Diet with 1200 ml Fluid Restriction    Intake/Output Summary (Last 24 hours) at 05/25/13 1049 Last data filed at 05/25/13 0900  Gross per 24  hour  Intake    890 ml  Output   3212 ml  Net  -2322 ml    Last BM: 4/7  Labs:   Recent Labs Lab 05/23/13 0455 05/24/13 0321 05/25/13 0610  NA 144 143 143  K 4.3 4.4 4.6  CL 99 100 101  CO2 26 25 22   BUN 100* 103* 71*  CREATININE 8.04* 7.48* 5.81*  CALCIUM 8.4 8.6 8.9  PHOS 6.9* 5.9* 6.7*  GLUCOSE 107* 106* 97    CBG (last 3)  No results found for this basename: GLUCAP,  in the last 72 hours  Scheduled Meds: . aspirin EC  325 mg Oral Q breakfast  . calcitRIOL  0.25 mcg Oral Daily  . darbepoetin (ARANESP) injection - NON-DIALYSIS  150 mcg Subcutaneous Q Sun-1800  . ferric gluconate (FERRLECIT/NULECIT) IV  125 mg Intravenous Daily  . levothyroxine  100 mcg Oral QAC breakfast  . multivitamin  1 tablet Oral QHS  . simvastatin  20 mg Oral q1800    Continuous Infusions: . sodium chloride 20 mL/hr (05/24/13 1407)  . sodium chloride 20 mL/hr at 05/25/13 0000  . lactated ringers      Jarold MottoSamantha Kendel Bessey MS, RD, LDN Inpatient Registered Dietitian Pager: [phone] After-hours pager: [phone]

## 2013-05-25 NOTE — Progress Notes (Signed)
    Subjective  - POD #1, status post dietetic catheter and left brachial brachial fistula    Physical Exam:   Complaining of pain at the incision site in the left antecubital area. Left hand is warm and well perfused       Assessment/Plan:  POD #1  Status post left fistula creation.  No evidence of steal syndrome.  The patient will followup in 6 weeks with duplex of his fistula.  At that time we will consider appropriateness of elevation  Robert Weber 05/25/2013 8:53 AM --  Ceasar MonsFiled Vitals:   05/25/13 0600  BP: 122/48  Pulse: 81  Temp: 98.3 F (36.8 C)  Resp: 18    Intake/Output Summary (Last 24 hours) at 05/25/13 0853 Last data filed at 05/24/13 2213  Gross per 24 hour  Intake    650 ml  Output   3212 ml  Net  -2562 ml     Laboratory CBC    Component Value Date/Time   WBC 8.3 05/25/2013 0610   HGB 8.2* 05/25/2013 0610   HCT 25.0* 05/25/2013 0610   PLT 197 05/25/2013 0610    BMET    Component Value Date/Time   NA 143 05/25/2013 0610   K 4.6 05/25/2013 0610   CL 101 05/25/2013 0610   CO2 22 05/25/2013 0610   GLUCOSE 97 05/25/2013 0610   BUN 71* 05/25/2013 0610   CREATININE 5.81* 05/25/2013 0610   CALCIUM 8.9 05/25/2013 0610   GFRNONAA 8* 05/25/2013 0610   GFRAA 10* 05/25/2013 0610    COAG No results found for this basename: INR, PROTIME   No results found for this basename: PTT    Antibiotics Anti-infectives   Start     Dose/Rate Route Frequency Ordered Stop   05/24/13 0600  [MAR Hold]  cefUROXime (ZINACEF) 1.5 g in dextrose 5 % 50 mL IVPB     (On MAR Hold since 05/24/13 1351)   1.5 g 100 mL/hr over 30 Minutes Intravenous On call to O.R. 05/23/13 1101 05/24/13 1456   05/20/13 1115  ceFAZolin (ANCEF) IVPB 2 g/50 mL premix     2 g 100 mL/hr over 30 Minutes Intravenous Every 6 hours 05/20/13 1105 05/20/13 1840   05/20/13 0600  ceFAZolin (ANCEF) IVPB 2 g/50 mL premix     2 g 100 mL/hr over 30 Minutes Intravenous On call to O.R. 05/20/13 0515 05/20/13 0803        V. Charlena CrossWells Weber IV, M.D. [facility] Office: [phone] Pager:  [phone]

## 2013-05-25 NOTE — Progress Notes (Addendum)
[facility] PROGRESS NOTE  Robert Weber ZOX:[id number] DOB: 1931-08-17 DOA: 05/18/2013 PCP: No primary provider on file.  Assessment/Plan: Principal Problem:   Fracture of femoral neck, left  -Reconsulted ortho 4/2 Dr Duda>> taken to OR on 4/3 and status post left hemiarthroplasty -appreciate Dr Audrie Lia assisatance -Bleeding from incision overnight 4/3>> now resolved -continue pain mangement -PT recommending SNF for rehabilitation     CKD (chronic kidney disease) stage 5, GFR less than 15 ml/min>> now ESRD per renal -Appreciate Dr. Deterding's assistance>> L IJ catheter placed along with permanent access by vascular on 05/24/2013>>  dialysis per renal      Postop anemia-acute blood loss -Status post Transfusion 2 units packed red blood cells>> hemoglobin 7.2 on 4/5 -Hemoglobin 6.6 4/6.>> Transfused another unit of packed red blood cells on 05-24-13, stable now.    Questionable hilar mass on chest x-ray.    CT scan ordered     Code Status: full Family Communication: none at bedside  Disposition Plan: likely will need SNF   Consultants:  ORTHO- Dr Lajoyce Corners  renal  Procedures:  ORIF 05-20-13, CT chest  DVT prophylaxis Heparin on 05/25/2013, discussed with Dr. Lajoyce Corners  Antibiotics:  none   HPI/Subjective: States he feels weak, denies chest pain and no shortness of breath  Objective: Filed Vitals:   05/25/13 1022  BP: 112/56  Pulse: 84  Temp: 99.3 F (37.4 C)  Resp: 18    Intake/Output Summary (Last 24 hours) at 05/25/13 1147 Last data filed at 05/25/13 0900  Gross per 24 hour  Intake    890 ml  Output   3212 ml  Net  -2322 ml   Filed Weights   05/20/13 1106 05/24/13 1855 05/24/13 2213  Weight: 83.5 kg (184 lb 1.4 oz) 82.9 kg (182 lb 12.2 oz) 79.4 kg (175 lb 0.7 oz)    Exam:  General: alert & oriented x 3 In NAD Cardiovascular: RRR, nl S1 s2 Respiratory: CTAB Abdomen: soft +BS NT/ND, no masses palpable Extremities: No cyanosis and no edema      Data Reviewed: Basic Metabolic Panel:  Recent Labs Lab 05/21/13 0430 05/22/13 0626 05/23/13 0455 05/24/13 0321 05/25/13 0610  NA 145 142 144 143 143  K 4.2 4.2 4.3 4.4 4.6  CL 104 98 99 100 101  CO2 22 25 26 25 22   GLUCOSE 169* 115* 107* 106* 97  BUN 75* 90* 100* 103* 71*  CREATININE 6.61* 8.11* 8.04* 7.48* 5.81*  CALCIUM 8.1* 8.0* 8.4 8.6 8.9  PHOS 6.6* 7.7* 6.9* 5.9* 6.7*   Liver Function Tests:  Recent Labs Lab 05/21/13 0430 05/22/13 0626 05/23/13 0455 05/24/13 0321 05/25/13 0610  AST  --   --   --  29  --   ALT  --   --   --  <5  --   ALKPHOS  --   --   --  48  --   BILITOT  --   --   --  0.4  --   PROT  --   --   --  5.4*  --   ALBUMIN 2.4* 2.0* 1.9* 2.0* 2.2*   No results found for this basename: LIPASE, AMYLASE,  in the last 168 hours No results found for this basename: AMMONIA,  in the last 168 hours CBC:  Recent Labs Lab 05/21/13 0430 05/22/13 0626 05/23/13 0455 05/24/13 0321 05/25/13 0610  WBC 8.7 7.4 6.8 7.0 8.3  HGB 6.8* 7.2* 6.6* 7.5* 8.2*  HCT 20.4* 20.7* 19.6* 22.5* 25.0*  MCV 97.1 92.4 94.7 93.8 95.8  PLT 179 124* 145* 162 197   Cardiac Enzymes: No results found for this basename: CKTOTAL, CKMB, CKMBINDEX, TROPONINI,  in the last 168 hours BNP (last 3 results) No results found for this basename: PROBNP,  in the last 8760 hours CBG: No results found for this basename: GLUCAP,  in the last 168 hours  Recent Results (from the past 240 hour(s))  SURGICAL PCR SCREEN     Status: None   Collection Time    05/20/13  6:00 AM      Result Value Ref Range Status   MRSA, PCR NEGATIVE  NEGATIVE Final   Staphylococcus aureus NEGATIVE  NEGATIVE Final   Comment:            The Xpert SA Assay (FDA     approved for NASAL specimens     in patients over 78 years of age),     is one component of     a comprehensive surveillance     program.  Test performance has     been validated by [facility] for patients greater     than or equal  to 78 year old.     It is not intended     to diagnose infection nor to     guide or monitor treatment.     Studies: Dg Chest Port 1 View  05/24/2013   CLINICAL DATA:  Central line placement.  Chronic kidney disease.  EXAM: PORTABLE CHEST - 1 VIEW  COMPARISON:  05/19/2013  FINDINGS: Double-lumen central venous catheter is been inserted and the tips are in good position in the right atrium. Heart size is normal. There is prominence of the left hilum which may represent enlargement of the main pulmonary artery but this appears more prominent than on the study of 04/02/2010. The possibility of adenopathy should be considered.  Pulmonary vascularity is normal. Lungs are clear. The patient has a prominent left pericardial fat pad. No acute osseous abnormality. No pneumothorax.  IMPRESSION: 1. No pneumothorax after central line placement. 2. Central line appears in good position. 3. Abnormal enlargement of the left hilum. The possibility of a mass or adenopathy should be considered. This represents change from the study of 04/02/2010.   Electronically Signed   By: Geanie CooleyJim  Maxwell M.D.   On: 05/24/2013 18:36   Dg Fluoro Guide Cv Line-no Report  05/24/2013   CLINICAL DATA: diatek cath   FLOURO GUIDE CV LINE  Fluoroscopy was utilized by the requesting physician.  No radiographic  interpretation.     Scheduled Meds: . aspirin EC  325 mg Oral Q breakfast  . calcitRIOL  0.25 mcg Oral Daily  . darbepoetin (ARANESP) injection - NON-DIALYSIS  150 mcg Subcutaneous Q Sun-1800  . ferric gluconate (FERRLECIT/NULECIT) IV  125 mg Intravenous Daily  . levothyroxine  100 mcg Oral QAC breakfast  . multivitamin  1 tablet Oral QHS  . simvastatin  20 mg Oral q1800   Continuous Infusions: . sodium chloride 20 mL/hr (05/24/13 1407)  . sodium chloride 20 mL/hr at 05/25/13 0000  . lactated ringers      Principal Problem:   Fracture of femoral neck, left Active Problems:   CKD (chronic kidney disease) stage 5, GFR less  than 15 ml/min   Hip fracture    Time spent: 25    Robert Weber  [facility] Pager [phone]. If 7PM-7AM, please contact night-coverage at [email or web address], password [email or web address] 05/25/2013, 11:47  AM  LOS: 7 days              and failure

## 2013-05-26 ENCOUNTER — Encounter (HOSPITAL_COMMUNITY): Payer: Self-pay | Admitting: Vascular Surgery

## 2013-05-26 LAB — RENAL FUNCTION PANEL
ALBUMIN: 2.2 g/dL — AB (ref 3.5–5.2)
BUN: 106 mg/dL — ABNORMAL HIGH (ref 6–23)
CHLORIDE: 100 meq/L (ref 96–112)
CO2: 21 mEq/L (ref 19–32)
CREATININE: 7.7 mg/dL — AB (ref 0.50–1.35)
Calcium: 9.1 mg/dL (ref 8.4–10.5)
GFR calc Af Amer: 7 mL/min — ABNORMAL LOW (ref >90)
GFR, EST NON AFRICAN AMERICAN: 6 mL/min — AB (ref >90)
Glucose, Bld: 101 mg/dL — ABNORMAL HIGH (ref 70–99)
POTASSIUM: 4.6 meq/L (ref 3.7–5.3)
Phosphorus: 7.3 mg/dL — ABNORMAL HIGH (ref 2.3–4.6)
Sodium: 143 mEq/L (ref 137–147)

## 2013-05-26 LAB — CBC
HCT: 24.9 % — ABNORMAL LOW (ref 39.0–52.0)
Hemoglobin: 8 g/dL — ABNORMAL LOW (ref 13.0–17.0)
MCH: 31 pg (ref 26.0–34.0)
MCHC: 32.1 g/dL (ref 30.0–36.0)
MCV: 96.5 fL (ref 78.0–100.0)
Platelets: 237 10*3/uL (ref 150–400)
RBC: 2.58 MIL/uL — ABNORMAL LOW (ref 4.22–5.81)
RDW: 15 % (ref 11.5–15.5)
WBC: 8.4 10*3/uL (ref 4.0–10.5)

## 2013-05-26 LAB — HEMOGLOBIN AND HEMATOCRIT, BLOOD
HEMATOCRIT: 24.5 % — AB (ref 39.0–52.0)
Hemoglobin: 8 g/dL — ABNORMAL LOW (ref 13.0–17.0)

## 2013-05-26 MED ORDER — BISACODYL 5 MG PO TBEC: 10.0000 mg | DELAYED_RELEASE_TABLET | Freq: Every day | ORAL | Status: AC | PRN

## 2013-05-26 MED ORDER — ALTEPLASE 2 MG IJ SOLR
2.0000 mg | Freq: Once | INTRAMUSCULAR | Status: DC | PRN
  Filled 2013-05-26: qty 2

## 2013-05-26 MED ORDER — HEPARIN SODIUM (PORCINE) 1000 UNIT/ML DIALYSIS
40.0000 [IU]/kg | Freq: Once | INTRAMUSCULAR | Status: DC
  Filled 2013-05-26: qty 4

## 2013-05-26 MED ORDER — SODIUM CHLORIDE 0.9 % IV SOLN
100.0000 mL | INTRAVENOUS | Status: DC | PRN
Start: 2013-05-26 — End: 2013-05-26

## 2013-05-26 MED ORDER — HEPARIN SODIUM (PORCINE) 1000 UNIT/ML DIALYSIS
1000.0000 [IU] | INTRAMUSCULAR | Status: DC | PRN
  Filled 2013-05-26: qty 1

## 2013-05-26 MED ORDER — NEPRO/CARBSTEADY PO LIQD: 237.0000 mL | ORAL | Status: DC | PRN

## 2013-05-26 MED ORDER — LIDOCAINE-PRILOCAINE 2.5-2.5 % EX CREA
1.0000 | TOPICAL_CREAM | CUTANEOUS | Status: DC | PRN
Start: 2013-05-26 — End: 2013-05-26

## 2013-05-26 MED ORDER — PENTAFLUOROPROP-TETRAFLUOROETH EX AERO: 1.0000 "application " | INHALATION_SPRAY | CUTANEOUS | Status: DC | PRN

## 2013-05-26 MED ORDER — HYDROCODONE-ACETAMINOPHEN 5-325 MG PO TABS: 1.0000 | ORAL_TABLET | Freq: Four times a day (QID) | ORAL | Status: AC | PRN

## 2013-05-26 MED ORDER — SODIUM CHLORIDE 0.9 % IV SOLN: 100.0000 mL | INTRAVENOUS | Status: DC | PRN

## 2013-05-26 MED ORDER — LIDOCAINE HCL (PF) 1 % IJ SOLN: 5.0000 mL | INTRAMUSCULAR | Status: DC | PRN

## 2013-05-26 NOTE — Progress Notes (Signed)
Patient ID: Robert Weber, male   DOB: 08/30/1931, 80 y.o.   MRN: [id number] Patient is ESRD

## 2013-05-26 NOTE — Discharge Summary (Signed)
Robert Weber, is a 80 y.o. male  DOB 02/17/1931  MRN [id number].  Admission date:  05/18/2013  Admitting Physician  Clydia LlanoMutaz Elmahi, MD  Discharge Date:  05/26/2013   Primary MD  No primary provider on file.  Recommendations for primary care physician for things to follow:   Date CBC BMP in 2-3 days   Admission Diagnosis  RENAL FAILURE FX - LEFT FEMORAL NECK Left Femoral Neck Fracture ESRD   Discharge Diagnosis  RENAL FAILURE FX - LEFT FEMORAL NECK Left Femoral Neck Fracture ESRD    Principal Problem:   Fracture of femoral neck, left Active Problems:   CKD (chronic kidney disease) stage 5, GFR less than 15 ml/min   Hip fracture      Past Medical History  Diagnosis Date  . CKD (chronic kidney disease) stage 5, GFR less than 15 ml/min   . HTN (hypertension)   . High cholesterol     Past Surgical History  Procedure Laterality Date  . Hip arthroplasty Left 05/20/2013    Procedure: LEFT HIP HEMIARTHROPLASTY;  Surgeon: Nadara MustardMarcus V Duda, MD;  Location: [facility] OR;  Service: Orthopedics;  Laterality: Left;  . Insertion of dialysis catheter Right 05/24/2013    Procedure: INSERTION OF DIALYSIS CATHETER with Intraop Ultrasound;  Surgeon: Sherren Kernsharles E Fields, MD;  [facility];  Service: Vascular;  Laterality: Right;  . Av fistula placement Left 05/24/2013    Procedure: Creation Left Arm Arteriovenous Fistula with Intraop Ultrasound;  Surgeon: Sherren Kernsharles E Fields, MD;  [facility];  Service: Vascular;  Laterality: Left;     Discharge Condition: Stable   Follow UP  Follow-up Information   Follow up with DUDA,MARCUS V, MD In 2 weeks.   Specialty:  Orthopedic Surgery   Contact information:   [address] [place] [address] [phone]       Follow up with PCP. Schedule an appointment as soon as possible for a visit in 1 week.         Discharge Instructions  and  Discharge Medications          Discharge Orders   Future Appointments Provider Department Dept Phone   07/07/2013 1:00 PM [facility] [address] [phone]   07/07/2013 2:00 PM Sherren Kernsharles E Fields, MD [facility] [phone]   Future Orders Complete By Expires   Discharge instructions  As directed    Scheduling Instructions:   Follow with Primary MD and Dr Lajoyce Cornersuda in 7 days   Get CBC, CMP, checked 7 days by Primary MD and again as instructed by your Primary MD.     Activity: Weight bearing on the right hip as tolerated with Full fall precautions use walker/cane & assistance as needed   Disposition SNF   Diet: Renal with 1.5 L fluid restriction on a daily basis  For Heart failure patients - Check your Weight same time everyday, if you gain over 2 pounds, or you develop in leg swelling, experience more shortness of breath or  chest pain, call your Primary MD immediately. Follow Cardiac Low Salt Diet and 1.5 lit/day fluid restriction.   On your next visit with her primary care physician please Get Medicines reviewed and adjusted.  Please request your Prim.MD to go over all Hospital Tests and Procedure/Radiological results at the follow up, please get all Hospital records sent to your Prim MD by signing hospital release before you go home.   If you experience worsening of your admission symptoms, develop shortness of breath, life threatening emergency, suicidal or homicidal thoughts you must seek medical attention immediately by calling 911 or calling your MD immediately  if symptoms less severe.  You Must read complete instructions/literature along with all the possible adverse reactions/side effects for all the Medicines you take and that have been prescribed to you. Take any new Medicines after you have completely understood and accpet all the possible adverse reactions/side effects.    Do not drive and provide baby sitting services if your were admitted for syncope or siezures until you have seen by Primary MD or a Neurologist and advised to do so again.  Do not drive when taking Pain medications.    Do not take more than prescribed Pain, Sleep and Anxiety Medications  Special Instructions: If you have smoked or chewed Tobacco  in the last 2 yrs please stop smoking, stop any regular Alcohol  and or any Recreational drug use.  Wear Seat belts while driving.   Please note  You were cared for by a hospitalist during your hospital stay. If you have any questions about your discharge medications or the care you received while you were in the hospital after you are discharged, you can call the unit and asked to speak with the hospitalist on call if the hospitalist that took care of you is not available. Once you are discharged, your primary care physician will handle any further medical issues. Please note that NO REFILLS for any discharge medications will be authorized once you are discharged, as it is imperative that you return to your primary care physician (or establish a relationship with a primary care physician if you do not have one) for your aftercare needs so that they can reassess your need for medications and monitor your lab values.   Increase activity slowly  As directed    Weight bearing as tolerated  As directed    Questions:     Laterality:  left   Extremity:         Medication List    STOP taking these medications       furosemide 40 MG tablet  Commonly known as:  LASIX      TAKE these medications       acetaminophen 500 MG tablet  Commonly known as:  TYLENOL  Take 1 tablet (500 mg total) by mouth every 6 (six) hours as needed for mild pain.     amLODipine 10 MG tablet  Commonly known as:  NORVASC  Take 10 mg by mouth daily.     aspirin EC 81 MG EC tablet  Generic drug:  aspirin  Take 81 mg by mouth daily. Swallow whole.     aspirin EC  325 MG tablet  Take 1 tablet (325 mg total) by mouth daily.     bisacodyl 5 MG EC tablet  Commonly known as:  DULCOLAX  Take 2 tablets (10 mg total) by mouth daily as needed for moderate constipation.     calcitRIOL 0.25 MCG capsule  Commonly known  as:  ROCALTROL  Take 0.25 mcg by mouth daily.     HYDROcodone-acetaminophen 5-325 MG per tablet  Commonly known as:  NORCO/VICODIN  Take 1 tablet by mouth every 6 (six) hours as needed. PAIN     levothyroxine 100 MCG tablet  Commonly known as:  SYNTHROID, LEVOTHROID  Take 100 mcg by mouth daily.     pravastatin 40 MG tablet  Commonly known as:  PRAVACHOL  Take 40 mg by mouth daily.          Diet and Activity recommendation: See Discharge Instructions above   Consults obtained - renal, orthopedics   Major procedures and Radiology Reports - PLEASE review detailed and final reports for all details, in brief -   ORIF R Femur   Ct Chest Wo Contrast  05/25/2013   CLINICAL DATA:  Abnormal chest x-ray.  Possible left hilar mass.  EXAM: CT CHEST WITHOUT CONTRAST  TECHNIQUE: Multidetector CT imaging of the chest was performed following the standard protocol without IV contrast.  COMPARISON:  DG CHEST 1V PORT dated 05/24/2013  FINDINGS: Heart is borderline in size. Ascending aorta is slightly dilated at 4.1 cm. No hilar mass. No lung abnormality in the region of the hila, in particular the left. Minimal dependent atelectasis in the left base. No pleural effusions. No mediastinal, hilar, or axillary adenopathy. Chest wall soft tissues are unremarkable.  Right dialysis catheter is in place. Imaging into the upper abdomen shows no acute findings.  No acute bony abnormality.  IMPRESSION: No evidence of left hilar or pulmonary mass. Minimal left base atelectasis.   Electronically Signed   By: Charlett Nose M.D.   On: 05/25/2013 15:11   Dg Pelvis Portable  05/20/2013   CLINICAL DATA:  Postop left hip  EXAM: PORTABLE PELVIS 1-2 VIEWS  COMPARISON:  DG  HIP COMPLETE 2+V BILAT dated 05/18/2013  FINDINGS: Two spot AP projection radiographs of the pelvis are provided for review.  Images demonstrate the sequela of left bipolar hip replacement. Alignment appears near anatomic. No fracture or dislocation given anterior projection. There is expected subcutaneous emphysema about the operative site. Skin staples overlie the upper outer aspect of the left thigh. No radiopaque foreign body.  IMPRESSION: Post left bipolar hip replacement without evidence of complication.   Electronically Signed   By: Simonne Come M.D.   On: 05/20/2013 10:07   Dg Chest Port 1 View  05/24/2013   CLINICAL DATA:  Central line placement.  Chronic kidney disease.  EXAM: PORTABLE CHEST - 1 VIEW  COMPARISON:  05/19/2013  FINDINGS: Double-lumen central venous catheter is been inserted and the tips are in good position in the right atrium. Heart size is normal. There is prominence of the left hilum which may represent enlargement of the main pulmonary artery but this appears more prominent than on the study of 04/02/2010. The possibility of adenopathy should be considered.  Pulmonary vascularity is normal. Lungs are clear. The patient has a prominent left pericardial fat pad. No acute osseous abnormality. No pneumothorax.  IMPRESSION: 1. No pneumothorax after central line placement. 2. Central line appears in good position. 3. Abnormal enlargement of the left hilum. The possibility of a mass or adenopathy should be considered. This represents change from the study of 04/02/2010.   Electronically Signed   By: Geanie Cooley M.D.   On: 05/24/2013 18:36   Dg Chest Port 1 View  05/19/2013   CLINICAL DATA:  Infiltrate.  EXAM: PORTABLE CHEST - 1 VIEW  COMPARISON:  DG CHEST 1V dated 05/18/2013  FINDINGS: Mediastinum and hilar structures normal. Subsegmental atelectasis lung bases. No focal alveolar infiltrate. No pleural effusion or pneumothorax. Cardiomegaly with normal pulmonary vascularity. No acute bony  abnormality. Degenerative changes thoracic spine and both shoulders.  IMPRESSION: 1. Cardiomegaly, no CHF. 2. Mild bibasilar atelectasis.  No focal alveolar infiltrate.   Electronically Signed   By: Maisie Fus  Register   On: 05/19/2013 15:21   Dg Fluoro Guide Cv Line-no Report  05/24/2013   CLINICAL DATA: diatek cath   FLOURO GUIDE CV LINE  Fluoroscopy was utilized by the requesting physician.  No radiographic  interpretation.     Micro Results      Recent Results (from the past 240 hour(s))  SURGICAL PCR SCREEN     Status: None   Collection Time    05/20/13  6:00 AM      Result Value Ref Range Status   MRSA, PCR NEGATIVE  NEGATIVE Final   Staphylococcus aureus NEGATIVE  NEGATIVE Final   Comment:            The Xpert SA Assay (FDA     approved for NASAL specimens     in patients over 73 years of age),     is one component of     a comprehensive surveillance     program.  Test performance has     been validated by [employer] [facility] for patients greater     than or equal to 30 year old.     It is not intended     to diagnose infection nor to     guide or monitor treatment.     History of present illness and  Hospital Course:     Kindly see H&P for history of present illness and admission details, please review complete Labs, Consult reports and Test reports for all details in brief Robert Weber, is a 80 y.o. male, patient with history of   ESRD started on dialysis, hypertension, dyslipidemia came to the hospital for mechanical fall with left-sided hip pain secondary to femoral fracture.     Fracture of femoral neck, left  Post open reduction internal fixation by Dr. Aldean Baker on 05/20/2013, weight bearing as tolerated, wound care, and will require physical therapy at rehabilitation, follow with Dr. Lajoyce Corners in a week. Aspirin for DVT prophylaxis per Dr. Lajoyce Corners   CKD (chronic kidney disease) stage 5, GFR less than 15 ml/min>> now ESRD per renal  -Appreciate Dr. Deterding's assistance>> L  IJ catheter placed along with permanent access by vascular on 05/24/2013>> dialysis per renal to be continued post discharge.     Postop anemia-acute blood loss  -Status post Transfusion 2 units packed red blood cells>> hemoglobin 7.2 on 4/5  -Hemoglobin 6.6 4/6.>> Transfused another unit of packed red blood cells on 05-24-13, stable now.     Questionable hilar mass on chest x-ray.  CT scan chest shows no evidence of mediastinal mass.    Hypertension stable on Norvasc.   Today   Subjective:   Robert Weber today has no headache,no chest abdominal pain,no new weakness tingling or numbness, feels much better .  Objective:   Blood pressure 106/58, pulse 75, temperature 99.3 F (37.4 C), temperature source Oral, resp. rate 18, height 6' (1.829 m), weight 78.6 kg (173 lb 4.5 oz), SpO2 90.00%.   Intake/Output Summary (Last 24 hours) at 05/26/13 1021 Last data filed at 05/26/13 0900  Gross per 24 hour  Intake  360 ml  Output   1104 ml  Net   -744 ml    Exam Awake Alert, Oriented *3, No new F.N deficits, Normal affect [place].AT,PERRAL Supple Neck,No JVD, No cervical lymphadenopathy appriciated.  Symmetrical Chest wall movement, Good air movement bilaterally, CTAB RRR,No Gallops,Rubs or new Murmurs, No Parasternal Heave +ve B.Sounds, Abd Soft, Non tender, No organomegaly appriciated, No rebound -guarding or rigidity. No Cyanosis, Clubbing or edema, No new Rash or bruise  Data Review   CBC w Diff: Lab Results  Component Value Date   WBC 8.3 05/25/2013   HGB 8.2* 05/25/2013   HCT 25.0* 05/25/2013   PLT 197 05/25/2013    CMP: Lab Results  Component Value Date   NA 143 05/25/2013   K 4.6 05/25/2013   CL 101 05/25/2013   CO2 22 05/25/2013   BUN 71* 05/25/2013   CREATININE 5.81* 05/25/2013   PROT 5.4* 05/24/2013   ALBUMIN 2.2* 05/25/2013   BILITOT 0.4 05/24/2013   ALKPHOS 48 05/24/2013   AST 29 05/24/2013   ALT <5 05/24/2013  .   Total Time in preparing paper work, data evaluation and todays exam  - 35 minutes  Leroy Sea M.D on 05/26/2013 at 10:21 AM  [facility] Office  [phone]

## 2013-05-26 NOTE — Progress Notes (Signed)
05/26/2013 6:30 PM  [facility] All questions answered.   Robert Weber

## 2013-05-26 NOTE — Progress Notes (Signed)
Hemodialysis Outpatient note: [facility] MWF schedule chair time of 615am. Pt can start tomorrow Friday, April 10th at 930AM. Welcome letter has been given to patient.

## 2013-05-26 NOTE — Progress Notes (Signed)
05/26/2013 2:40 PM  Used lift to get patient OOB to recliner for HD.  So far, patient tolerating recliner well for treatment.  Will monitor. Joya SanAllyson D Raliegh Scobie

## 2013-05-26 NOTE — Progress Notes (Signed)
Subjective: Interval History: has complaints frustrated at being so weak.  Objective: Vital signs in last 24 hours: Temp:  [98.4 F (36.9 C)-99.3 F (37.4 C)] 99.3 F (37.4 C) (04/09 0913) Pulse Rate:  [73-77] 75 (04/09 0913) Resp:  [16-18] 18 (04/09 0913) BP: (103-127)/(51-70) 106/58 mmHg (04/09 0913) SpO2:  [90 %-94 %] 90 % (04/09 0913) Weight:  [78.6 kg (173 lb 4.5 oz)] 78.6 kg (173 lb 4.5 oz) (04/08 2134) Weight change: -4.3 kg (-9 lb 7.7 oz)  Intake/Output from previous day: 04/08 0701 - 04/09 0700 In: 360 [P.O.:360] Out: 1102 [Urine:1101; Stool:1] Intake/Output this shift: Total I/O In: 240 [P.O.:240] Out: 2 [Urine:1; Stool:1]  General appearance: alert, cooperative, mildly obese and pale Resp: diminished breath sounds bilaterally Cardio: S1, S2 normal and systolic murmur: systolic ejection 2/6, decrescendo at 2nd left intercostal space GI: pos bs, liver down 3 cm, soft Extremities: edema tr and L hip swollen, AVF LUA B&T,  RIJ PC  Lab Results:  Recent Labs  05/24/13 0321 05/25/13 0610  WBC 7.0 8.3  HGB 7.5* 8.2*  HCT 22.5* 25.0*  PLT 162 197   BMET:  Recent Labs  05/24/13 0321 05/25/13 0610  NA 143 143  K 4.4 4.6  CL 100 101  CO2 25 22  GLUCOSE 106* 97  BUN 103* 71*  CREATININE 7.48* 5.81*  CALCIUM 8.6 8.9   No results found for this basename: PTH,  in the last 72 hours Iron Studies: No results found for this basename: IRON, TIBC, TRANSFERRIN, FERRITIN,  in the last 72 hours  Studies/Results: Ct Chest Wo Contrast  05/25/2013   CLINICAL DATA:  Abnormal chest x-ray.  Possible left hilar mass.  EXAM: CT CHEST WITHOUT CONTRAST  TECHNIQUE: Multidetector CT imaging of the chest was performed following the standard protocol without IV contrast.  COMPARISON:  DG CHEST 1V PORT dated 05/24/2013  FINDINGS: Heart is borderline in size. Ascending aorta is slightly dilated at 4.1 cm. No hilar mass. No lung abnormality in the region of the hila, in particular the  left. Minimal dependent atelectasis in the left base. No pleural effusions. No mediastinal, hilar, or axillary adenopathy. Chest wall soft tissues are unremarkable.  Right dialysis catheter is in place. Imaging into the upper abdomen shows no acute findings.  No acute bony abnormality.  IMPRESSION: No evidence of left hilar or pulmonary mass. Minimal left base atelectasis.   Electronically Signed   By: Charlett Nose M.D.   On: 05/25/2013 15:11   Dg Chest Port 1 View  05/24/2013   CLINICAL DATA:  Central line placement.  Chronic kidney disease.  EXAM: PORTABLE CHEST - 1 VIEW  COMPARISON:  05/19/2013  FINDINGS: Double-lumen central venous catheter is been inserted and the tips are in good position in the right atrium. Heart size is normal. There is prominence of the left hilum which may represent enlargement of the main pulmonary artery but this appears more prominent than on the study of 04/02/2010. The possibility of adenopathy should be considered.  Pulmonary vascularity is normal. Lungs are clear. The patient has a prominent left pericardial fat pad. No acute osseous abnormality. No pneumothorax.  IMPRESSION: 1. No pneumothorax after central line placement. 2. Central line appears in good position. 3. Abnormal enlargement of the left hilum. The possibility of a mass or adenopathy should be considered. This represents change from the study of 04/02/2010.   Electronically Signed   By: Geanie Cooley M.D.   On: 05/24/2013 18:36   Dg Fluoro  Guide Cv Line-no Report  05/24/2013   CLINICAL DATA: diatek cath   FLOURO GUIDE CV LINE  Fluoroscopy was utilized by the requesting physician.  No radiographic  interpretation.     I have reviewed the patient's current medications.  Assessment/Plan: 1 CRF for 2nd HD, use PC 2 Anemia check Hb at HD 3 s/p hip  Fx, needs PT 4 HPTH meds 5 Debill P HD, epo, vit D, mobilize    LOS: 8 days   Ramie Erman L Sybrina Laning 05/26/2013,11:09 AM

## 2013-05-26 NOTE — Discharge Instructions (Signed)
Follow with Primary MD and Dr Lajoyce Cornersuda in 7 days   Get CBC, CMP, checked 7 days by Primary MD and again as instructed by your Primary MD.     Activity: Weight bearing on the right hip as tolerated with Full fall precautions use walker/cane & assistance as needed   Disposition SNF   Diet: Renal with 1.5 L fluid restriction on a daily basis  For Heart failure patients - Check your Weight same time everyday, if you gain over 2 pounds, or you develop in leg swelling, experience more shortness of breath or chest pain, call your Primary MD immediately. Follow Cardiac Low Salt Diet and 1.5 lit/day fluid restriction.   On your next visit with her primary care physician please Get Medicines reviewed and adjusted.  Please request your Prim.MD to go over all Hospital Tests and Procedure/Radiological results at the follow up, please get all Hospital records sent to your Prim MD by signing hospital release before you go home.   If you experience worsening of your admission symptoms, develop shortness of breath, life threatening emergency, suicidal or homicidal thoughts you must seek medical attention immediately by calling 911 or calling your MD immediately  if symptoms less severe.  You Must read complete instructions/literature along with all the possible adverse reactions/side effects for all the Medicines you take and that have been prescribed to you. Take any new Medicines after you have completely understood and accpet all the possible adverse reactions/side effects.   Do not drive and provide baby sitting services if your were admitted for syncope or siezures until you have seen by Primary MD or a Neurologist and advised to do so again.  Do not drive when taking Pain medications.    Do not take more than prescribed Pain, Sleep and Anxiety Medications  Special Instructions: If you have smoked or chewed Tobacco  in the last 2 yrs please stop smoking, stop any regular Alcohol  and or any  Recreational drug use.  Wear Seat belts while driving.   Please note  You were cared for by a hospitalist during your hospital stay. If you have any questions about your discharge medications or the care you received while you were in the hospital after you are discharged, you can call the unit and asked to speak with the hospitalist on call if the hospitalist that took care of you is not available. Once you are discharged, your primary care physician will handle any further medical issues. Please note that NO REFILLS for any discharge medications will be authorized once you are discharged, as it is imperative that you return to your primary care physician (or establish a relationship with a primary care physician if you do not have one) for your aftercare needs so that they can reassess your need for medications and monitor your lab values.

## 2013-06-27 ENCOUNTER — Telehealth: Payer: Self-pay | Admitting: Vascular Surgery

## 2013-06-27 NOTE — Telephone Encounter (Signed)
Confirmed May 8,2015 as date of death per obituary.

## 2013-06-27 NOTE — Telephone Encounter (Signed)
Received a Engineer, technical salesvoicemail from GilliamHolly at [facility] [place]. Mr Memory Argueance passed away on 02-05-14. I have canceled all outstanding appointments.

## 2013-07-07 ENCOUNTER — Ambulatory Visit: Payer: PRIVATE HEALTH INSURANCE | Admitting: Vascular Surgery

## 2013-07-07 ENCOUNTER — Other Ambulatory Visit (HOSPITAL_COMMUNITY): Payer: PRIVATE HEALTH INSURANCE

## 2013-07-18 DEATH — deceased

## 2013-09-19 NOTE — Telephone Encounter (Signed)
Patient status changed to deceased in [facility]. Robert Weber °

## 2014-08-09 IMAGING — CT CT CHEST W/O CM
2 of 3 series · 15 of 36 positions shown, 18 images · non-contrast
Comparison: DG CHEST 1V PORT dated 05/24/2013

CLINICAL DATA: Abnormal chest x-ray.  Possible left hilar mass.

EXAM:
CT CHEST WITHOUT CONTRAST
TECHNIQUE: Multidetector CT imaging of the chest was performed following the
standard protocol without IV contrast.

[Series 2: thorax 5.0 i31f 1 · axial · 0.80mm/px · z∈[-984,-649]mm · 12 of 79 slices shown, 15 images]
[im 6/79  mediastinal]
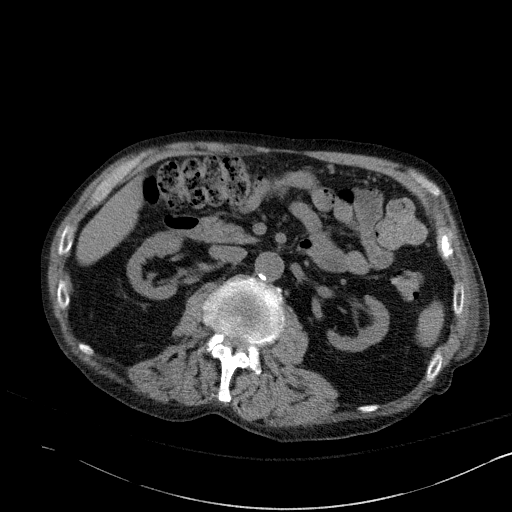
[im 6/79  lung]
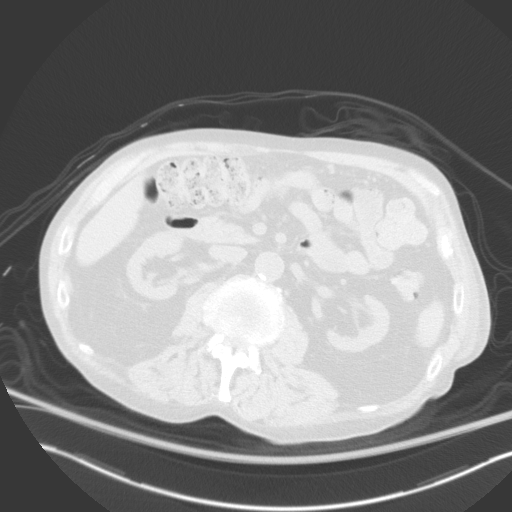
[im 12/79  lung]
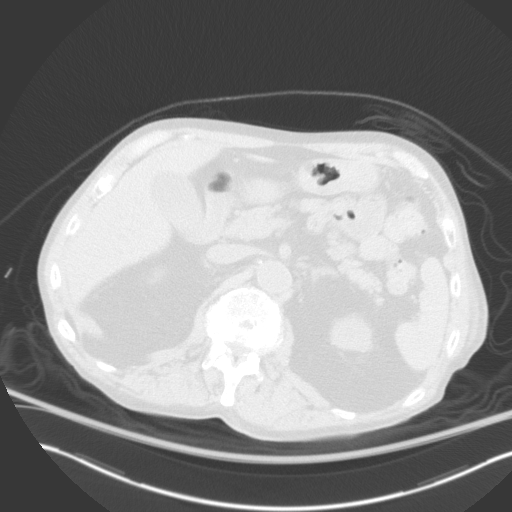
[im 18/79  lung]
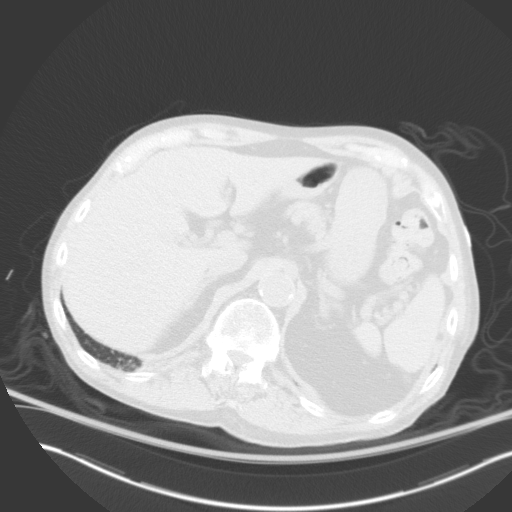
[im 24/79  lung]
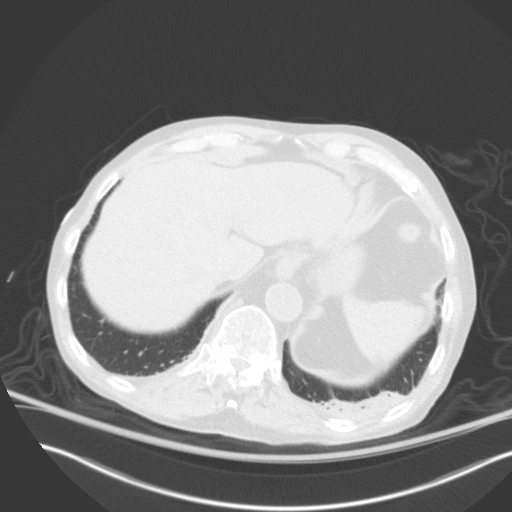
[im 29/79  mediastinal]
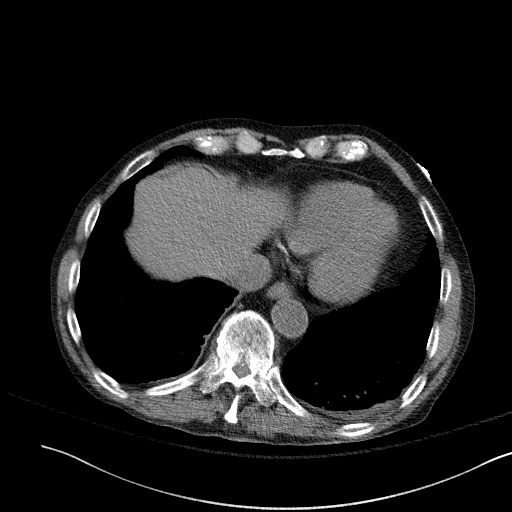
[im 29/79  lung]
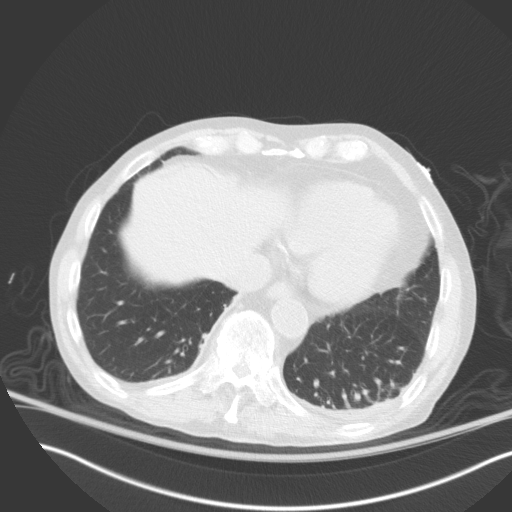
[im 35/79  lung]
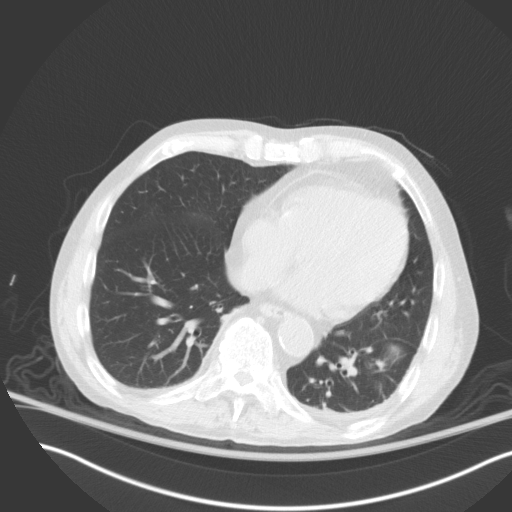
[im 44/79  lung]
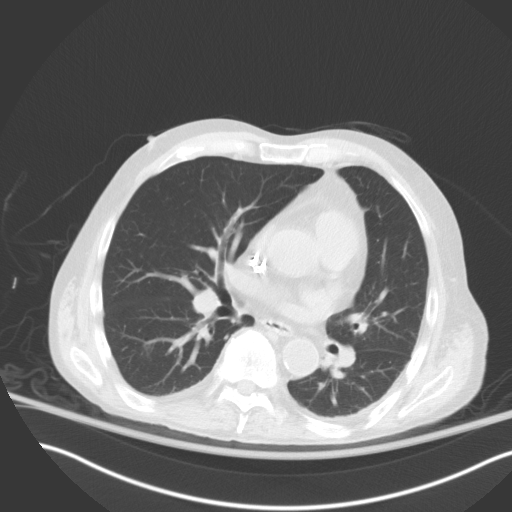
[im 50/79  lung]
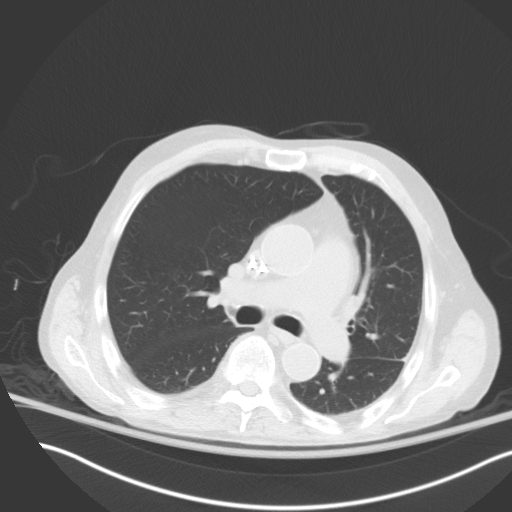
[im 55/79  mediastinal]
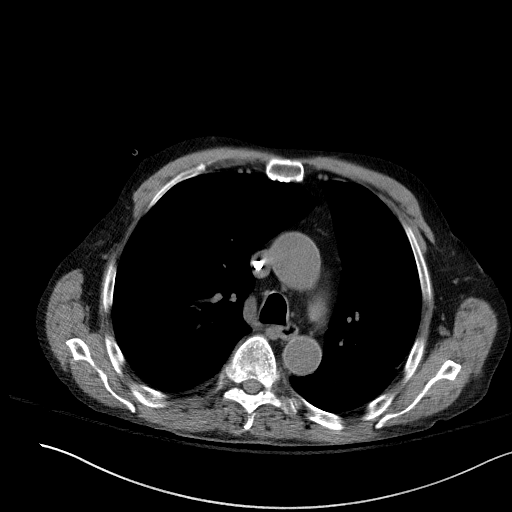
[im 55/79  lung]
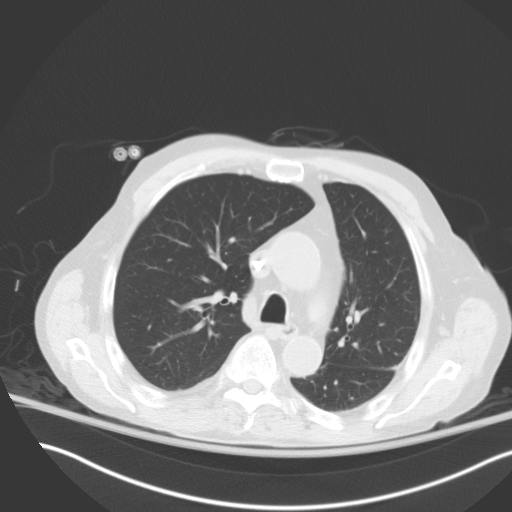
[im 61/79  lung]
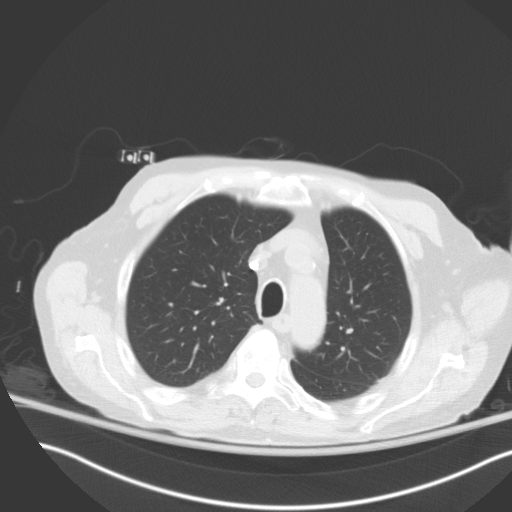
[im 67/79  lung]
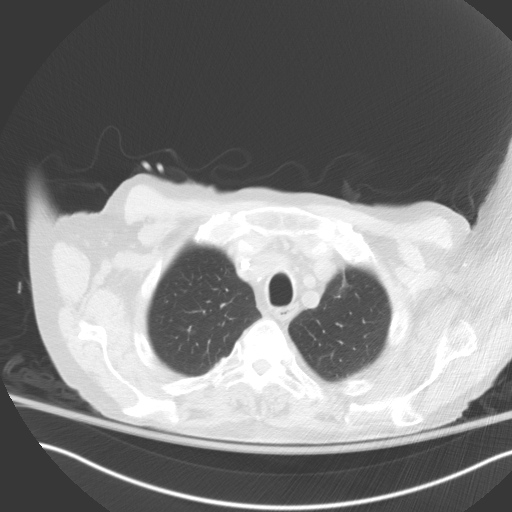
[im 73/79  lung]
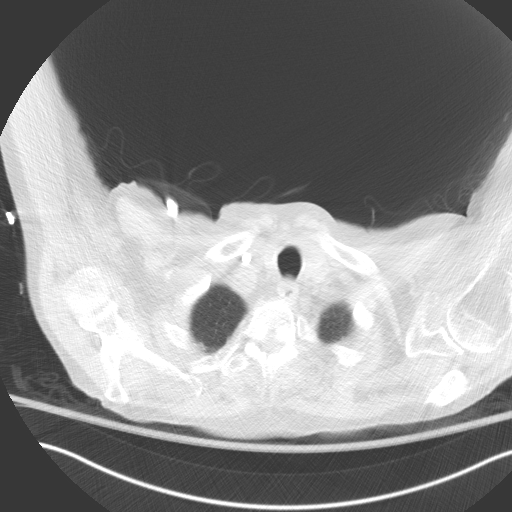

[Series 5: coronal · coronal · 0.69mm/px · 3 of 80 slices shown]
[im 16/80  lung]
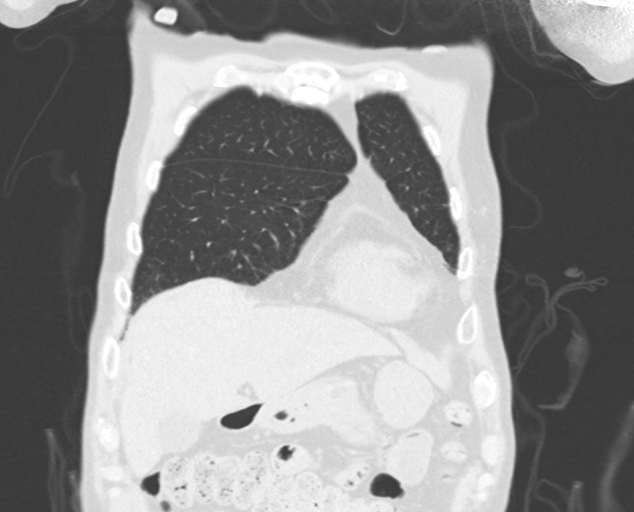
[im 32/80  lung]
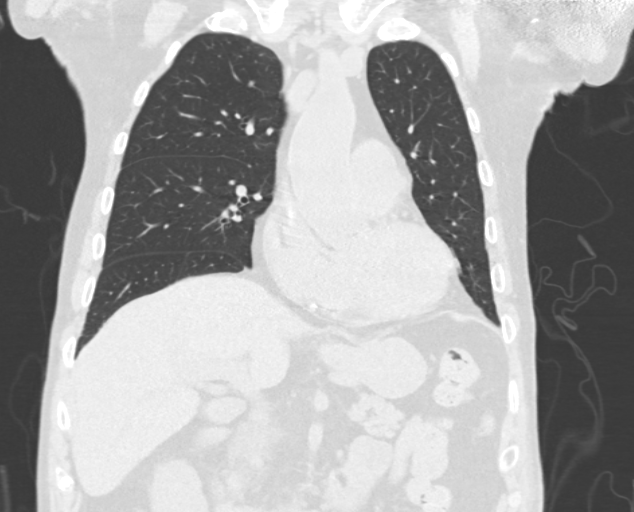
[im 48/80  lung]
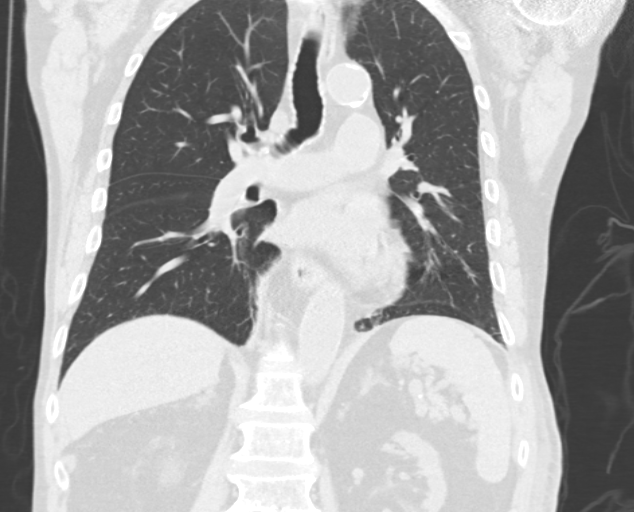

[15 of 36 positions shown; findings below may reference images not displayed]

FINDINGS: Heart is borderline in size. Ascending aorta is slightly dilated at
4.1 cm. No hilar mass. No lung abnormality in the region of the
hila, in particular the left. Minimal dependent atelectasis in the
left base. No pleural effusions. No mediastinal, hilar, or axillary
adenopathy. Chest wall soft tissues are unremarkable.

Right dialysis catheter is in place. Imaging into the upper abdomen
shows no acute findings.

No acute bony abnormality.
IMPRESSION: No evidence of left hilar or pulmonary mass. Minimal left base
atelectasis.
